# Patient Record
Sex: Male | Born: 1960 | Race: Black or African American | Hispanic: No | Marital: Married | Smoking: Current every day smoker
Health system: Southern US, Community
[De-identification: ages and names within clinical notes are randomized; demographics above are authoritative.]

## PROBLEM LIST (undated history)

## (undated) DIAGNOSIS — T7840XA Allergy, unspecified, initial encounter: Secondary | ICD-10-CM

## (undated) HISTORY — PX: NO PAST SURGERIES: SHX2092

---

## 2005-08-06 ENCOUNTER — Emergency Department (HOSPITAL_COMMUNITY): Admission: EM | Admit: 2005-08-06 | Discharge: 2005-08-06 | Payer: Self-pay | Admitting: *Deleted

## 2005-10-10 ENCOUNTER — Emergency Department (HOSPITAL_COMMUNITY): Admission: EM | Admit: 2005-10-10 | Discharge: 2005-10-10 | Payer: Self-pay | Admitting: Emergency Medicine

## 2008-09-02 ENCOUNTER — Emergency Department (HOSPITAL_COMMUNITY): Admission: EM | Admit: 2008-09-02 | Discharge: 2008-09-02 | Payer: Self-pay | Admitting: Emergency Medicine

## 2012-10-14 ENCOUNTER — Encounter (HOSPITAL_COMMUNITY): Payer: Self-pay | Admitting: Emergency Medicine

## 2012-10-14 ENCOUNTER — Emergency Department (HOSPITAL_COMMUNITY)
Admission: EM | Admit: 2012-10-14 | Discharge: 2012-10-14 | Disposition: A | Discharge status: Home / Self Care | Payer: Self-pay | Attending: Emergency Medicine | Admitting: Emergency Medicine

## 2012-10-14 DIAGNOSIS — J309 Allergic rhinitis, unspecified: Secondary | ICD-10-CM | POA: Insufficient documentation

## 2012-10-14 DIAGNOSIS — J302 Other seasonal allergic rhinitis: Secondary | ICD-10-CM

## 2012-10-14 DIAGNOSIS — J019 Acute sinusitis, unspecified: Secondary | ICD-10-CM

## 2012-10-14 HISTORY — DX: Allergy, unspecified, initial encounter: T78.40XA

## 2012-10-14 MED ORDER — AMOXICILLIN 500 MG PO CAPS: 1000.0000 mg | ORAL_CAPSULE | Freq: Three times a day (TID) | ORAL | Status: DC

## 2012-10-14 NOTE — ED Provider Notes (Signed)
History     CSN: [id number]  Arrival date & time 10/14/12  1231   First MD Initiated Contact with Patient 10/14/12 1328      Chief Complaint  Patient presents with  . Facial Swelling    (Consider location/radiation/quality/duration/timing/severity/associated sxs/prior treatment) HPI Comments: Patient presents with a chief complaint of swelling of the right side of his face in the area of the maxillary sinus.  He reports that he feels that area has been swollen for the past 2-3 days.  Today he felt that the left side of his face became swollen.  He reports that he does feel pressure in that area.  He denies any erythema or warmth.  He has had nasal congestion and post nasal drip also for the past several days.  No fever or chills.  Denies swelling of his lips, tongue, or throat.  Denies any dental pain.  He has not taken anything for his symptoms.  He does have a history of Seasonal Allergies and Sinus Infections.    The history is provided by the patient.    Past Medical History  Diagnosis Date  . Allergic     No past surgical history on file.  No family history on file.  History  Substance Use Topics  . Smoking status: Not on file  . Smokeless tobacco: Not on file  . Alcohol Use: Not on file      Review of Systems  Constitutional: Negative for fever and chills.  HENT: Positive for congestion, rhinorrhea and sinus pressure.   Skin: Negative for color change.  All other systems reviewed and are negative.    Allergies  Review of patient's allergies indicates no known allergies.  Home Medications  No current outpatient prescriptions on file.  BP 136/82  Pulse 84  Temp(Src) 98.4 F (36.9 C) (Oral)  Resp 18  SpO2 100%  Physical Exam  Nursing note and vitals reviewed. Constitutional: He appears well-developed and well-nourished.  HENT:  Head: Normocephalic and atraumatic. No trismus in the jaw.  Right Ear: Hearing, tympanic membrane and ear canal normal.   Left Ear: Hearing, tympanic membrane and ear canal normal.  Nose: Mucosal edema and rhinorrhea present. Right sinus exhibits maxillary sinus tenderness. Right sinus exhibits no frontal sinus tenderness. Left sinus exhibits maxillary sinus tenderness. Left sinus exhibits no frontal sinus tenderness.  Mouth/Throat: Uvula is midline, oropharynx is clear and moist and mucous membranes are normal.  No swelling of the face visualized No erythema or warmth of the face.  Neck: Normal range of motion. Neck supple.  Cardiovascular: Normal rate, regular rhythm and normal heart sounds.   Pulmonary/Chest: Effort normal and breath sounds normal.  Neurological: He is alert.  Skin: Skin is warm and dry. No erythema.    ED Course  Procedures (including critical care time)  Labs Reviewed - No data to display No results found.   No diagnosis found.    MDM  Patient presenting with a chief complaint of tenderness and swelling of the area of the maxillary sinuses.  No obvious swelling on exam.  No erythema or warmth of the skin.  Suspect Acute Sinusitis.  Patient treated with antibiotic.  Return precautions given.        Pascal Lux [place], PA-C 10/15/12 1620

## 2012-10-14 NOTE — ED Notes (Signed)
On Mon pt began having swelling to rt side of face and then noticed it to cheek ara. Not on any bp medications no swelling noted any other areas. Intermit pain. No sob, denies any allergy

## 2012-10-16 NOTE — ED Provider Notes (Signed)
Medical screening examination/treatment/procedure(s) were performed by non-physician practitioner and as supervising physician I was immediately available for consultation/collaboration.  Chrishawna Farina R. Emanuella Nickle, MD 10/16/12 0737 

## 2013-02-09 ENCOUNTER — Encounter: Payer: Self-pay | Admitting: *Deleted

## 2013-03-27 ENCOUNTER — Emergency Department (HOSPITAL_COMMUNITY)
Admission: EM | Admit: 2013-03-27 | Discharge: 2013-03-27 | Disposition: A | Discharge status: Home / Self Care | Payer: Self-pay | Attending: Emergency Medicine | Admitting: Emergency Medicine

## 2013-03-27 ENCOUNTER — Encounter (HOSPITAL_COMMUNITY): Payer: Self-pay | Admitting: *Deleted

## 2013-03-27 DIAGNOSIS — Z792 Long term (current) use of antibiotics: Secondary | ICD-10-CM | POA: Insufficient documentation

## 2013-03-27 DIAGNOSIS — F1420 Cocaine dependence, uncomplicated: Secondary | ICD-10-CM | POA: Insufficient documentation

## 2013-03-27 NOTE — ED Provider Notes (Signed)
CSN: [id number]     Arrival date & time 03/27/13  1924 History  This chart was scribed for non-physician practitioner,Joselito Fieldhouse Hector Shade, PA-C ,working with Suzi Roots, MD, by Karle Plumber, ED Scribe.  This patient was seen in room [facility]TR3/[facility]3 and the patient's care was started at 7:57 PM.    Chief Complaint  Patient presents with  . Medical Clearance   The history is provided by the patient. No language interpreter was used.   HPI Comments:  Brett Wood is a 52 y.o. male who presents to the Emergency Department complaining of cocaine dependence. He states he wants to go through detox and have help with quitting. His last use approximately 24 hours ago. He denies hallucinations and suicidal or homicidal ideations. He states he drinks alcohol and smokes marijuana occasionally. Pt denies nausea, vomiting, or any other pertinent medical history.  Past Medical History  Diagnosis Date  . Allergic    No past surgical history on file. No family history on file. History  Substance Use Topics  . Smoking status: Not on file  . Smokeless tobacco: Not on file  . Alcohol Use: Not on file    Review of Systems  Constitutional: Negative for fever.  Gastrointestinal: Negative for nausea, vomiting and abdominal pain.  Psychiatric/Behavioral: Negative for suicidal ideas and hallucinations.  All other systems reviewed and are negative.    Allergies  Review of patient's allergies indicates no known allergies.  Home Medications   Current Outpatient Rx  Name  Route  Sig  Dispense  Refill  . amoxicillin (AMOXIL) 500 MG capsule   Oral   Take 2 capsules (1,000 mg total) by mouth 3 (three) times daily.   60 capsule   0    Triage Vitals: BP 151/80  Pulse 55  Temp(Src) 98.6 F (37 C) (Oral)  Resp 18  Ht 5\' 11"  (1.803 m)  Wt 185 lb (83.915 kg)  BMI 25.81 kg/m2  SpO2 100% Physical Exam  Nursing note and vitals reviewed. Constitutional: He is oriented to person, place, and time. He  appears well-developed and well-nourished.  HENT:  Head: Normocephalic and atraumatic.  Neck: Normal range of motion.  Cardiovascular: Normal rate and regular rhythm.   Pulmonary/Chest: Effort normal.  Abdominal: Soft. Bowel sounds are normal.  Neurological: He is alert and oriented to person, place, and time.  Skin: Skin is warm and dry.  Psychiatric: He has a normal mood and affect. His behavior is normal.    ED Course  Procedures (including critical care time) DIAGNOSTIC STUDIES: Oxygen Saturation is 100% on RA, normal by my interpretation.   COORDINATION OF CARE: 8:03 PM- Will provide information for community support and detox programs. Pt verbalizes understanding and agrees to plan.  Labs Review Labs Reviewed - No data to display Imaging Review No results found.  MDM  No diagnosis found. 1. Cocaine dependence  I personally performed the services described in this documentation, which was scribed in my presence. The recorded information has been reviewed and is accurate.   He is not homicidal or suicidal. Discussed resources for outpatient support in breaking his dependence on cocaine, and resources were provided. Stable for discharge.   Arnoldo Hooker, PA-C 03/27/13 2009  Arnoldo Hooker, PA-C 03/27/13 2011

## 2013-03-27 NOTE — ED Notes (Signed)
PA at bedside.

## 2013-03-27 NOTE — ED Notes (Signed)
Pt states he has used cocaine since he was 52 years old and he wants detox from cocaine,  Pt is alert and oriented and behavior apprioprate

## 2013-03-29 NOTE — ED Provider Notes (Signed)
Medical screening examination/treatment/procedure(s) were performed by non-physician practitioner and as supervising physician I was immediately available for consultation/collaboration.   Suzi Roots, MD 03/29/13 [phone]

## 2016-12-26 ENCOUNTER — Encounter (HOSPITAL_COMMUNITY): Payer: Self-pay

## 2016-12-26 ENCOUNTER — Emergency Department (HOSPITAL_COMMUNITY): Payer: Self-pay

## 2016-12-26 ENCOUNTER — Inpatient Hospital Stay (HOSPITAL_COMMUNITY)
Admission: EM | Admit: 2016-12-26 | Discharge: 2016-12-28 | DRG: 728 | Disposition: A | Discharge status: Home / Self Care | Payer: Self-pay | Attending: Nephrology | Admitting: Nephrology

## 2016-12-26 DIAGNOSIS — N3090 Cystitis, unspecified without hematuria: Secondary | ICD-10-CM

## 2016-12-26 DIAGNOSIS — Z720 Tobacco use: Secondary | ICD-10-CM

## 2016-12-26 DIAGNOSIS — N451 Epididymitis: Principal | ICD-10-CM | POA: Diagnosis present

## 2016-12-26 DIAGNOSIS — K5900 Constipation, unspecified: Secondary | ICD-10-CM | POA: Diagnosis present

## 2016-12-26 DIAGNOSIS — R739 Hyperglycemia, unspecified: Secondary | ICD-10-CM | POA: Diagnosis present

## 2016-12-26 DIAGNOSIS — N390 Urinary tract infection, site not specified: Secondary | ICD-10-CM

## 2016-12-26 DIAGNOSIS — N3091 Cystitis, unspecified with hematuria: Secondary | ICD-10-CM | POA: Diagnosis present

## 2016-12-26 DIAGNOSIS — B9620 Unspecified Escherichia coli [E. coli] as the cause of diseases classified elsewhere: Secondary | ICD-10-CM | POA: Diagnosis present

## 2016-12-26 DIAGNOSIS — N5089 Other specified disorders of the male genital organs: Secondary | ICD-10-CM | POA: Diagnosis present

## 2016-12-26 DIAGNOSIS — N50819 Testicular pain, unspecified: Secondary | ICD-10-CM

## 2016-12-26 DIAGNOSIS — F17210 Nicotine dependence, cigarettes, uncomplicated: Secondary | ICD-10-CM | POA: Diagnosis present

## 2016-12-26 LAB — COMPREHENSIVE METABOLIC PANEL
ALBUMIN: 3.5 g/dL (ref 3.5–5.0)
ALT: 13 U/L — ABNORMAL LOW (ref 17–63)
ANION GAP: 10 (ref 5–15)
AST: 15 U/L (ref 15–41)
Alkaline Phosphatase: 77 U/L (ref 38–126)
BUN: 20 mg/dL (ref 6–20)
CHLORIDE: 102 mmol/L (ref 101–111)
CO2: 23 mmol/L (ref 22–32)
Calcium: 10.3 mg/dL (ref 8.9–10.3)
Creatinine, Ser: 1.05 mg/dL (ref 0.61–1.24)
GFR calc Af Amer: >60 mL/min (ref >60)
GFR calc non Af Amer: >60 mL/min (ref >60)
GLUCOSE: 156 mg/dL — AB (ref 65–99)
POTASSIUM: 4 mmol/L (ref 3.5–5.1)
SODIUM: 135 mmol/L (ref 135–145)
Total Bilirubin: 1.1 mg/dL (ref 0.3–1.2)
Total Protein: 8.2 g/dL — ABNORMAL HIGH (ref 6.5–8.1)

## 2016-12-26 LAB — CBC
HEMATOCRIT: 40.7 % (ref 39.0–52.0)
HEMOGLOBIN: 13.8 g/dL (ref 13.0–17.0)
MCH: 30.5 pg (ref 26.0–34.0)
MCHC: 33.9 g/dL (ref 30.0–36.0)
MCV: 89.8 fL (ref 78.0–100.0)
Platelets: 199 10*3/uL (ref 150–400)
RBC: 4.53 MIL/uL (ref 4.22–5.81)
RDW: 13.4 % (ref 11.5–15.5)
WBC: 15.1 10*3/uL — ABNORMAL HIGH (ref 4.0–10.5)

## 2016-12-26 LAB — URINALYSIS, ROUTINE W REFLEX MICROSCOPIC
GLUCOSE, UA: NEGATIVE mg/dL
KETONES UR: 15 mg/dL — AB
NITRITE: POSITIVE — AB
Specific Gravity, Urine: >1.03 — ABNORMAL HIGH (ref 1.005–1.030)
Squamous Epithelial / LPF: NONE SEEN
pH: 6 (ref 5.0–8.0)

## 2016-12-26 LAB — LIPASE, BLOOD: Lipase: 29 U/L (ref 11–51)

## 2016-12-26 LAB — I-STAT CG4 LACTIC ACID, ED
Lactic Acid, Venous: 1.35 mmol/L (ref 0.5–1.9)
Lactic Acid, Venous: 1.05 mmol/L (ref 0.5–1.9)

## 2016-12-26 MED ORDER — MORPHINE SULFATE (PF) 2 MG/ML IV SOLN: 4.0000 mg | Freq: Once | INTRAVENOUS | Status: DC

## 2016-12-26 MED ORDER — MORPHINE SULFATE (PF) 2 MG/ML IV SOLN: 2.0000 mg | INTRAVENOUS | Status: DC | PRN

## 2016-12-26 MED ORDER — ACETAMINOPHEN 325 MG PO TABS
650.0000 mg | ORAL_TABLET | Freq: Once | ORAL | Status: AC
  Administered 2016-12-26: 650 mg via ORAL
  Filled 2016-12-26: qty 2

## 2016-12-26 MED ORDER — ORAL CARE MOUTH RINSE
15.0000 mL | Freq: Two times a day (BID) | OROMUCOSAL | Status: DC
  Administered 2016-12-26: 15 mL via OROMUCOSAL

## 2016-12-26 MED ORDER — NICOTINE 14 MG/24HR TD PT24
14.0000 mg | MEDICATED_PATCH | Freq: Every day | TRANSDERMAL | Status: DC
  Administered 2016-12-26 – 2016-12-28 (×3): 14 mg via TRANSDERMAL
  Filled 2016-12-26 (×3): qty 1

## 2016-12-26 MED ORDER — SODIUM CHLORIDE 0.9 % IV BOLUS (SEPSIS)
1000.0000 mL | Freq: Once | INTRAVENOUS | Status: AC
  Administered 2016-12-26: 1000 mL via INTRAVENOUS

## 2016-12-26 MED ORDER — ADULT MULTIVITAMIN W/MINERALS CH
1.0000 | ORAL_TABLET | Freq: Every day | ORAL | Status: DC
  Administered 2016-12-26 – 2016-12-27 (×2): 1 via ORAL
  Filled 2016-12-26 (×3): qty 1

## 2016-12-26 MED ORDER — CHLORHEXIDINE GLUCONATE 0.12 % MT SOLN
15.0000 mL | Freq: Two times a day (BID) | OROMUCOSAL | Status: DC
  Administered 2016-12-26: 15 mL via OROMUCOSAL
  Filled 2016-12-26: qty 15

## 2016-12-26 MED ORDER — ENOXAPARIN SODIUM 40 MG/0.4ML ~~LOC~~ SOLN
40.0000 mg | SUBCUTANEOUS | Status: DC
  Administered 2016-12-26 – 2016-12-27 (×2): 40 mg via SUBCUTANEOUS
  Filled 2016-12-26 (×2): qty 0.4

## 2016-12-26 MED ORDER — LEVOFLOXACIN IN D5W 500 MG/100ML IV SOLN
500.0000 mg | Freq: Once | INTRAVENOUS | Status: AC
  Administered 2016-12-26: 500 mg via INTRAVENOUS
  Filled 2016-12-26: qty 100

## 2016-12-26 MED ORDER — SODIUM CHLORIDE 0.9 % IV SOLN
INTRAVENOUS | Status: AC
  Administered 2016-12-26 – 2016-12-27 (×3): via INTRAVENOUS

## 2016-12-26 MED ORDER — DEXTROSE 5 % IV SOLN: 1.0000 g | Freq: Once | INTRAVENOUS | Status: DC

## 2016-12-26 MED ORDER — VITAMIN B-1 100 MG PO TABS
100.0000 mg | ORAL_TABLET | Freq: Every day | ORAL | Status: DC
  Administered 2016-12-26 – 2016-12-27 (×2): 100 mg via ORAL
  Filled 2016-12-26 (×3): qty 1

## 2016-12-26 MED ORDER — ONDANSETRON HCL 4 MG PO TABS: 4.0000 mg | ORAL_TABLET | Freq: Four times a day (QID) | ORAL | Status: DC | PRN

## 2016-12-26 MED ORDER — LEVOFLOXACIN IN D5W 500 MG/100ML IV SOLN
500.0000 mg | INTRAVENOUS | Status: DC
  Administered 2016-12-27 – 2016-12-28 (×2): 500 mg via INTRAVENOUS
  Filled 2016-12-26 (×3): qty 100

## 2016-12-26 MED ORDER — ONDANSETRON HCL 4 MG/2ML IJ SOLN: 4.0000 mg | Freq: Once | INTRAMUSCULAR | Status: DC

## 2016-12-26 MED ORDER — OXYCODONE HCL 5 MG PO TABS: 5.0000 mg | ORAL_TABLET | ORAL | Status: DC | PRN

## 2016-12-26 MED ORDER — ACETAMINOPHEN 650 MG RE SUPP: 650.0000 mg | Freq: Four times a day (QID) | RECTAL | Status: DC | PRN

## 2016-12-26 MED ORDER — ACETAMINOPHEN 325 MG PO TABS
650.0000 mg | ORAL_TABLET | Freq: Four times a day (QID) | ORAL | Status: DC | PRN
  Administered 2016-12-26: 650 mg via ORAL
  Filled 2016-12-26: qty 2

## 2016-12-26 MED ORDER — ONDANSETRON HCL 4 MG/2ML IJ SOLN: 4.0000 mg | Freq: Four times a day (QID) | INTRAMUSCULAR | Status: DC | PRN

## 2016-12-26 NOTE — ED Notes (Signed)
Bed: [id number] Expected date:  Expected time:  Means of arrival:  Comments: EMS- 55yo M , abdominal/groin pain, hematuria

## 2016-12-26 NOTE — ED Notes (Signed)
EDPA Provider at bedside. 

## 2016-12-26 NOTE — ED Notes (Signed)
[name] AT BEDSIDE PERFORMING BLOOD CULTURES

## 2016-12-26 NOTE — ED Notes (Signed)
INQUIRED FROM MATT Q RN IF BLOOD CULTURES WERE OBTAINED BEFORE ANTIBIOTIC GIVEN. NO BLOOD CULTURES WERE OBTAINED

## 2016-12-26 NOTE — ED Notes (Signed)
PT DRINKS " NOT EVERY DAY" HOWEVER DRINKS 1-2 40'S. LAST DRINK Saturday. STARTED FEELING BAD Sunday.

## 2016-12-26 NOTE — H&P (Signed)
[facility] History and Physical  Brett Wood ZOX:[id number] DOB: [birth year] DOA: 12/26/2016   PCP: Does not have a primary care doctor Specialists: None  Chief Complaint: Pain with urination and scrotal pain  HPI: Brett Wood is a 56 y.o. male with the no significant past medical history who does not take any medications at home and does not really follow-up with the medical providers who was in his usual state of health till about 2-3 days ago when he started having difficulty urinating. He had blood in the urine once couple days ago but nothing since then. However, he's had significant burning sensation with urination. He is also noted swelling of his scrotum, left side more than the right. He's had fever, chills. He has had nausea, vomiting, loss of appetite. The pain in the left side of the scrotum was 8 out of 10 in intensity. Worse with movement. No radiation. Sharp pain. He's never had similar issues before. Denies any abdominal or flank pain. No Back pain. He is heterosexual. Last sexual encounter was month and a half ago. He has protected sex and uses a condom. Denies any anal intercourse. Denies any history of STDs.  In the emergency department, patient was found to have an abnormal UA with leukocytosis. Ultrasound of the scrotum suggested epididymitis. No evidence for torsion.  Home Medications: Prior to Admission medications   Not on File    Allergies: No Known Allergies  Past Medical History: Past Medical History:  Diagnosis Date  . Allergic    Social History: Lives in [place]. Smokes 5-6 cigarettes on a daily basis.1-2 beers on a weekly basis. Last drink was on Saturday. Denies any illicit drug use. Independent with daily activities.   Family History: Patient denies any health problems in his family  Review of Systems - History obtained from the patient General ROS: positive for  - chills, fatigue and fever Psychological ROS: negative Ophthalmic ROS:  negative ENT ROS: negative Allergy and Immunology ROS: negative Hematological and Lymphatic ROS: negative Endocrine ROS: negative Respiratory ROS: no cough, shortness of breath, or wheezing Cardiovascular ROS: no chest pain or dyspnea on exertion Gastrointestinal ROS: As in history of present illness Genito-Urinary ROS: As in history of present illness Musculoskeletal ROS: negative Neurological ROS: no TIA or stroke symptoms Dermatological ROS: negative  Physical Examination  Vitals:   12/26/16 1033 12/26/16 1222 12/26/16 1347 12/26/16 1530  BP: 113/60 140/71 132/82 127/75  Pulse: 91 95 93 83  Resp: 18 18 18    Temp: (!) 102.6 F (39.2 C)  99.7 F (37.6 C)   TempSrc: Oral  Oral   SpO2: 99% 100% 99% 99%  Weight:      Height:        BP 127/75   Pulse 83   Temp 99.7 F (37.6 C) (Oral)   Resp 18   Ht 5\' 11"  (1.803 m)   Wt 83.9 kg (185 lb)   SpO2 99%   BMI 25.80 kg/m   General appearance: alert, cooperative, appears stated age and no distress Head: Normocephalic, without obvious abnormality, atraumatic Eyes: conjunctivae/corneas clear. PERRL, EOM's intact. Fundi benign. Throat: lips, mucosa, and tongue normal; teeth and gums normal and Dry mucous membranes Neck: no adenopathy, no carotid bruit, no JVD, supple, symmetrical, trachea midline and thyroid not enlarged, symmetric, no tenderness/mass/nodules Resp: clear to auscultation bilaterally Cardio: regular rate and rhythm, S1, S2 normal, no murmur, click, rub or gallop GI: Abdomen is soft. Mild tenderness appreciated in the suprapubic area without any rebound,  rigidity or guarding. No masses or organomegaly. Bowel sounds are present. Male genitalia: Left scrotum is noted to be enlarged compared to the right. Very tender to palpation. Right-sided testes normal. Extremities: extremities normal, atraumatic, no cyanosis or edema Pulses: 2+ and symmetric Skin: Skin color, texture, turgor normal. No rashes or lesions Lymph  nodes: Cervical, supraclavicular, and axillary nodes normal. Neurologic: Awake and alert. Oriented 2. No focal neurological deficits.   Labs on Admission: I have personally reviewed following labs and imaging studies  CBC:  Recent Labs Lab 12/26/16 1041  WBC 15.1*  HGB 13.8  HCT 40.7  MCV 89.8  PLT 199   Basic Metabolic Panel:  Recent Labs Lab 12/26/16 1041  NA 135  K 4.0  CL 102  CO2 23  GLUCOSE 156*  BUN 20  CREATININE 1.05  CALCIUM 10.3   GFR: Estimated Creatinine Clearance: 86.7 mL/min (by C-G formula based on SCr of 1.05 mg/dL). Liver Function Tests:  Recent Labs Lab 12/26/16 1041  AST 15  ALT 13*  ALKPHOS 77  BILITOT 1.1  PROT 8.2*  ALBUMIN 3.5    Recent Labs Lab 12/26/16 1041  LIPASE 29    Radiological Exams on Admission: US Scrotum  Result Date: 12/26/2016 CLINICAL DATA:  Acute left testicular pain. EXAM: SCROTAL ULTRASOUND DOPPLER ULTRASOUND OF THE TESTICLES TECHNIQUE: Complete ultrasound examination of the testicles, epididymis, and other scrotal structures was performed. Color and spectral Doppler ultrasound were also utilized to evaluate blood flow to the testicles. COMPARISON:  None. FINDINGS: Right testicle Measurements: 4.6 x 2.7 x 2.7 cm. No mass visualized. Multiple small calcifications are noted suggesting microlithiasis. Left testicle Measurements: 5.1 x 3.3 x 3.2 cm. No mass visualized. Multiple small calcifications are noted suggesting microlithiasis. Right epididymis:  3 mm cyst is noted.  Otherwise normal. Left epididymis: Significantly enlarged with increased flow seen on Doppler suggesting epididymitis. Hydrocele:  Mild left hydrocele is noted. Varicocele:  None visualized. Pulsed Doppler interrogation of both testes demonstrates normal low resistance arterial and venous waveforms bilaterally. IMPRESSION: Enlarged and hypervascular left epididymis is noted suggesting epididymitis. Mild left hydrocele is noted as well. No evidence of  testicular mass or torsion. 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 Current literature suggests that testicular microlithiasis is not a significant independent risk factor for development of testicular carcinoma, and that follow up imaging is not warranted in the absence of other risk factors. Monthly testicular self-examination and annual physical exams are considered appropriate surveillance. If patient has other risk factors for testicular carcinoma, then referral to Urology should be considered. (Reference: DeCastro, et al.: A 5-Year Follow up Study of Asymptomatic Men with Testicular Microlithiasis. J Urol 2008; 179:1420-1423.). Electronically Signed   By: Lupita Raider, M.D.   On: 12/26/2016 12:19   [place] Art/ven Flow Abd Pelv Doppler  Result Date: 12/26/2016 CLINICAL DATA:  Acute left testicular pain. EXAM: SCROTAL ULTRASOUND DOPPLER ULTRASOUND OF THE TESTICLES TECHNIQUE: Complete ultrasound examination of the testicles, epididymis, and other scrotal structures was performed. Color and spectral Doppler ultrasound were also utilized to evaluate blood flow to the testicles. COMPARISON:  None. FINDINGS: Right testicle Measurements: 4.6 x 2.7 x 2.7 cm. No mass visualized. Multiple small calcifications are noted suggesting microlithiasis. Left testicle Measurements: 5.1 x 3.3 x 3.2 cm. No mass visualized. Multiple small calcifications are noted suggesting microlithiasis. Right epididymis:  3 mm cyst is noted.  Otherwise normal. Left epididymis: Significantly enlarged with increased flow seen on Doppler suggesting epididymitis. Hydrocele:  Mild left hydrocele is noted. Varicocele:  None visualized. Pulsed Doppler interrogation of both testes demonstrates normal low resistance arterial and venous waveforms bilaterally. IMPRESSION: Enlarged and hypervascular left epididymis is noted suggesting epididymitis. Mild left hydrocele is noted as well. No evidence of testicular mass or torsion. Probable mild  bilateral testicular microlithiasis. Current literature suggests that testicular microlithiasis is not a significant independent risk factor for development of testicular carcinoma, and that follow up imaging is not warranted in the absence of other risk factors. Monthly testicular self-examination and annual physical exams are considered appropriate surveillance. If patient has other risk factors for testicular carcinoma, then referral to Urology should be considered. (Reference: DeCastro, et al.: A 5-Year Follow up Study of Asymptomatic Men with Testicular Microlithiasis. J Urol 2008; 179:1420-1423.). Electronically Signed   By: Lupita RaiderJames  Green Jr, M.D.   On: 12/26/2016 12:19     Problem List  Principal Problem:   Acute epididymitis Active Problems:   Acute lower UTI   Assessment: This is a 56 year old African American male who presents with dysuria, one episode of hematuria, left-sided scrotal swelling. Patient has a UTI and acute epididymitis.  Plan: #1 Urinary tract infection with acute epididymitis: GC and chlamydia probe has been ordered. Patient denies any high risk sexual behavior. Ultrasound does not show any torsion. Microlithiasis is noted. Will need outpatient urology follow-up. One episode of hematuria was most likely due to his urinary tract infection. Patient denies any back pain. His creatinine is normal. If he has recurrent episodes of hematuria he will need to undergo CT renal study. He will be treated with the Levaquin for now. Scrotal support. Observation overnight. Follow-up on HIV test.  #2 mild hyperglycemia: Follow-up on fasting level tomorrow.  #3 tobacco abuse: Nicotine patch. Patient was counseled.   DVT Prophylaxis: Lovenox Code Status: Full code Family Communication: Discussed with the patient  Disposition Plan: Likely return home when improved  Consults called: None  Admission status: Observation   Severity of Illness: The appropriate patient status for this  patient is OBSERVATION. Observation status is judged to be reasonable and necessary in order to provide the required intensity of service to ensure the patient's safety. The patient's presenting symptoms, physical exam findings, and initial radiographic and laboratory data in the context of their medical condition is felt to place them at decreased risk for further clinical deterioration. Furthermore, it is anticipated that the patient will be medically stable for discharge from the hospital within 2 midnights of admission. The following factors support the patient status of observation.   " The patient's presenting symptoms include dysuria. " The physical exam findings include mild suprapubic tenderness. " The initial radiographic and laboratory data suggests leukocytosis, UTI, epididymitis.   Further management decisions will depend on results of further testing and patient's response to treatment.   [facility] Maine Medical CenterKRISHNAN,Danney Bungert  [facility] Pager [phone]  If 7PM-7AM, please contact night-coverage [email or web address] Password [email or web address]  12/26/2016, 5:12 PM

## 2016-12-26 NOTE — ED Provider Notes (Signed)
[facility]-EMERGENCY DEPT Provider Note   CSN: [id number] Arrival date & time: 12/26/16  1022     History   Chief Complaint Chief Complaint  Patient presents with  . Abdominal Pain  . Hematuria  . Nausea  . Emesis    HPI Brett Wood is a 56 y.o. male.  HPI Brett Wood is a 56 y.o. male presents to emergency department complaining of abdominal pain, nausea, vomiting, hematuria, urinary urgency and hesitancy, scrotal pain. Patient states his symptoms started 4 days ago. Reports some difficulty urinating and noticing blood in his urine. Reports some constipation, denies diarrhea or blood in his stool. Denies blood in his emesis. Denies any fever or chills, however febrile in the emergency department. Reports some associated back pain. States pain that he is feeling comes and goes. Pain is sharp. Denies any history of the same. No medications taken prior to coming in.  Past Medical History:  Diagnosis Date  . Allergic     There are no active problems to display for this patient.   History reviewed. No pertinent surgical history.     Home Medications    Prior to Admission medications   Medication Sig Start Date End Date Taking? Authorizing Provider  amoxicillin (AMOXIL) 500 MG capsule Take 2 capsules (1,000 mg total) by mouth 3 (three) times daily. 10/14/12   Santiago GladLaisure, Heather, PA-C    Family History No family history on file.  Social History Social History  Substance Use Topics  . Smoking status: Current Every Day Smoker  . Smokeless tobacco: Never Used  . Alcohol use Yes     Allergies   Patient has no known allergies.   Review of Systems Review of Systems  Constitutional: Negative for chills and fever.  Respiratory: Negative for cough, chest tightness and shortness of breath.   Cardiovascular: Negative for chest pain, palpitations and leg swelling.  Gastrointestinal: Positive for abdominal pain, constipation, nausea and vomiting. Negative for abdominal distention,  blood in stool and diarrhea.  Genitourinary: Positive for difficulty urinating, hematuria, scrotal swelling, testicular pain and urgency. Negative for dysuria, frequency and penile swelling.  Musculoskeletal: Negative for arthralgias, myalgias, neck pain and neck stiffness.  Skin: Negative for rash.  Allergic/Immunologic: Negative for immunocompromised state.  Neurological: Negative for dizziness, weakness, light-headedness, numbness and headaches.  All other systems reviewed and are negative.    Physical Exam Updated Vital Signs BP 113/60 (BP Location: Right Arm)   Pulse 91   Temp (!) 102.6 F (39.2 C) (Oral)   Resp 18   Ht 5\' 11"  (1.803 m)   Wt 83.9 kg (185 lb)   SpO2 99%   BMI 25.80 kg/m   Physical Exam  Constitutional: He appears well-developed and well-nourished. No distress.  HENT:  Head: Normocephalic and atraumatic.  Eyes: Conjunctivae are normal.  Neck: Neck supple.  Cardiovascular: Normal rate, regular rhythm and normal heart sounds.   Pulmonary/Chest: Effort normal. No respiratory distress. He has no wheezes. He has no rales.  Abdominal: Soft. Bowel sounds are normal. He exhibits no distension. There is tenderness. There is no rebound.  LLQ tenderness, no guarding  Genitourinary: Penis normal.  Genitourinary Comments: Right scrotum normal. Left scrotum is enlarged, diffusely tender.  Musculoskeletal: He exhibits no edema.  Neurological: He is alert.  Skin: Skin is warm and dry.  Nursing note and vitals reviewed.    ED Treatments / Results  Labs (all labs ordered are listed, but only abnormal results are displayed) Labs Reviewed  COMPREHENSIVE METABOLIC PANEL -  Abnormal; Notable for the following:       Result Value   Glucose, Bld 156 (*)    Total Protein 8.2 (*)    ALT 13 (*)    All other components within normal limits  CBC - Abnormal; Notable for the following:    WBC 15.1 (*)    All other components within normal limits  URINALYSIS, ROUTINE W  REFLEX MICROSCOPIC - Abnormal; Notable for the following:    Color, Urine AMBER (*)    APPearance CLOUDY (*)    Specific Gravity, Urine >1.030 (*)    Hgb urine dipstick LARGE (*)    Bilirubin Urine SMALL (*)    Ketones, ur 15 (*)    Protein, ur >300 (*)    Nitrite POSITIVE (*)    Leukocytes, UA TRACE (*)    Bacteria, UA MANY (*)    All other components within normal limits  CULTURE, BLOOD (ROUTINE X 2)  CULTURE, BLOOD (ROUTINE X 2)  URINE CULTURE  LIPASE, BLOOD  I-STAT CG4 LACTIC ACID, ED  I-STAT CG4 LACTIC ACID, ED  GC/CHLAMYDIA PROBE AMP ([place]) NOT AT [facility]    EKG  EKG Interpretation None       Radiology US Scrotum  Result Date: 12/26/2016 CLINICAL DATA:  Acute left testicular pain. EXAM: SCROTAL ULTRASOUND DOPPLER ULTRASOUND OF THE TESTICLES TECHNIQUE: Complete ultrasound examination of the testicles, epididymis, and other scrotal structures was performed. Color and spectral Doppler ultrasound were also utilized to evaluate blood flow to the testicles. COMPARISON:  None. FINDINGS: Right testicle Measurements: 4.6 x 2.7 x 2.7 cm. No mass visualized. Multiple small calcifications are noted suggesting microlithiasis. Left testicle Measurements: 5.1 x 3.3 x 3.2 cm. No mass visualized. Multiple small calcifications are noted suggesting microlithiasis. Right epididymis:  3 mm cyst is noted.  Otherwise normal. Left epididymis: Significantly enlarged with increased flow seen on Doppler suggesting epididymitis. Hydrocele:  Mild left hydrocele is noted. Varicocele:  None visualized. Pulsed Doppler interrogation of both testes demonstrates normal low resistance arterial and venous waveforms bilaterally. IMPRESSION: Enlarged and hypervascular left epididymis is noted suggesting epididymitis. Mild left hydrocele is noted as well. No evidence of testicular mass or torsion. 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 Current literature suggests that testicular microlithiasis is not a  significant independent risk factor for development of testicular carcinoma, and that follow up imaging is not warranted in the absence of other risk factors. Monthly testicular self-examination and annual physical exams are considered appropriate surveillance. If patient has other risk factors for testicular carcinoma, then referral to Urology should be considered. (Reference: DeCastro, et al.: A 5-Year Follow up Study of Asymptomatic Men with Testicular Microlithiasis. J Urol 2008; 179:1420-1423.). Electronically Signed   By: Lupita Raider, M.D.   On: 12/26/2016 12:19   [place] Art/ven Flow Abd Pelv Doppler  Result Date: 12/26/2016 CLINICAL DATA:  Acute left testicular pain. EXAM: SCROTAL ULTRASOUND DOPPLER ULTRASOUND OF THE TESTICLES TECHNIQUE: Complete ultrasound examination of the testicles, epididymis, and other scrotal structures was performed. Color and spectral Doppler ultrasound were also utilized to evaluate blood flow to the testicles. COMPARISON:  None. FINDINGS: Right testicle Measurements: 4.6 x 2.7 x 2.7 cm. No mass visualized. Multiple small calcifications are noted suggesting microlithiasis. Left testicle Measurements: 5.1 x 3.3 x 3.2 cm. No mass visualized. Multiple small calcifications are noted suggesting microlithiasis. Right epididymis:  3 mm cyst is noted.  Otherwise normal. Left epididymis: Significantly enlarged with increased flow seen on Doppler suggesting epididymitis. Hydrocele:  Mild left  hydrocele is noted. Varicocele:  None visualized. Pulsed Doppler interrogation of both testes demonstrates normal low resistance arterial and venous waveforms bilaterally. IMPRESSION: Enlarged and hypervascular left epididymis is noted suggesting epididymitis. Mild left hydrocele is noted as well. No evidence of testicular mass or torsion. 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 Current literature suggests that testicular microlithiasis is not a significant independent risk factor for  development of testicular carcinoma, and that follow up imaging is not warranted in the absence of other risk factors. Monthly testicular self-examination and annual physical exams are considered appropriate surveillance. If patient has other risk factors for testicular carcinoma, then referral to Urology should be considered. (Reference: DeCastro, et al.: A 5-Year Follow up Study of Asymptomatic Men with Testicular Microlithiasis. J Urol 2008; 179:1420-1423.). Electronically Signed   By: Lupita Raider, M.D.   On: 12/26/2016 12:19    Procedures Procedures (including critical care time)  Medications Ordered in ED Medications  acetaminophen (TYLENOL) tablet 650 mg (not administered)     Initial Impression / Assessment and Plan / ED Course  I have reviewed the triage vital signs and the nursing notes.  Pertinent labs & imaging results that were available during my care of the patient were reviewed by me and considered in my medical decision making (see chart for details).     Patient emergency department with dysuria, urinary hesitancy and difficulty urinating. Also reports hematuria. On exam, mild lower abdominal pain mainly in the left lower quadrant, left testicular pain and swelling. Differential diagnosis includes UTI, epididymitis, orchitis, infected kidney stone. Patient is febrile here at 102.6. His heart rate is 91. Blood pressure is normal 113/60. Respiratory rate is normal. Will obtain blood cultures, lactic acid, CBC, CMP, urinalysis.   1:46 PM BP remaining stable. HR in 90s. US showing left epididymitis. Discussed with Dr. Madilyn Hook, will see pt. Covered with levaquin.  Discussed with hospitalist, will admit for further treatment.  Vitals:   12/26/16 1031 12/26/16 1033 12/26/16 1222 12/26/16 1347  BP:  113/60 140/71 132/82  Pulse:  91 95 93  Resp:  18 18 18   Temp:  (!) 102.6 F (39.2 C)  99.7 F (37.6 C)  TempSrc:  Oral  Oral  SpO2:  99% 100% 99%  Weight: 83.9 kg (185 lb)      Height: 5\' 11"  (1.803 m)         Final Clinical Impressions(s) / ED Diagnoses   Final diagnoses:  Testicular pain  Epididymitis  Cystitis    New Prescriptions New Prescriptions   No medications on file     Jaynie Crumble, Cordelia Poche 12/26/16 1600    Tilden Fossa, MD 12/27/16 0700

## 2016-12-26 NOTE — ED Triage Notes (Signed)
Per [facility], Pt, from home, c/o lower abdominal pain radiating into bilateral groins and hematuria x 4 days and n/v starting today.  Pain 6/10  increasing w/ urination.  No history of kidney stones.  Pt has not taken anything for symptoms.

## 2016-12-27 DIAGNOSIS — N3090 Cystitis, unspecified without hematuria: Secondary | ICD-10-CM

## 2016-12-27 LAB — CBC
HCT: 39.3 % (ref 39.0–52.0)
Hemoglobin: 13.3 g/dL (ref 13.0–17.0)
MCH: 30.1 pg (ref 26.0–34.0)
MCHC: 33.8 g/dL (ref 30.0–36.0)
MCV: 88.9 fL (ref 78.0–100.0)
PLATELETS: 203 10*3/uL (ref 150–400)
RBC: 4.42 MIL/uL (ref 4.22–5.81)
RDW: 13.4 % (ref 11.5–15.5)
WBC: 16.2 10*3/uL — AB (ref 4.0–10.5)

## 2016-12-27 LAB — COMPREHENSIVE METABOLIC PANEL
ALK PHOS: 71 U/L (ref 38–126)
ALT: 11 U/L — AB (ref 17–63)
AST: 14 U/L — AB (ref 15–41)
Albumin: 3.1 g/dL — ABNORMAL LOW (ref 3.5–5.0)
Anion gap: 9 (ref 5–15)
BILIRUBIN TOTAL: 1.3 mg/dL — AB (ref 0.3–1.2)
BUN: 15 mg/dL (ref 6–20)
CO2: 24 mmol/L (ref 22–32)
CREATININE: 0.86 mg/dL (ref 0.61–1.24)
Calcium: 9.9 mg/dL (ref 8.9–10.3)
Chloride: 105 mmol/L (ref 101–111)
GFR calc Af Amer: >60 mL/min (ref >60)
Glucose, Bld: 102 mg/dL — ABNORMAL HIGH (ref 65–99)
Potassium: 3.9 mmol/L (ref 3.5–5.1)
Sodium: 138 mmol/L (ref 135–145)
TOTAL PROTEIN: 7.4 g/dL (ref 6.5–8.1)

## 2016-12-27 LAB — HIV ANTIBODY (ROUTINE TESTING W REFLEX): HIV Screen 4th Generation wRfx: NONREACTIVE

## 2016-12-27 MED ORDER — DEXTROSE 5 % IV SOLN
1.0000 g | Freq: Once | INTRAVENOUS | Status: AC
  Administered 2016-12-27: 1 g via INTRAVENOUS
  Filled 2016-12-27: qty 10

## 2016-12-27 NOTE — Progress Notes (Signed)
PROGRESS NOTE  Brett Wood ZOX:[id number] DOB: 08-Jun-1961 DOA: 12/26/2016 PCP: Lavinia Sharps, NP   LOS: 0 days   Brief Narrative / Interim history: 56 year old male with no significant medical history presents with burning urination for the last few days as well as swelling in his left scrotum.  He also is complaining of subjective fever and chills, nausea vomiting and poor appetite.  Assessment & Plan: Principal Problem:   Acute epididymitis Active Problems:   Acute lower UTI  Urinary tract infection -Patient with evidence of a UTI on urinalysis, urine cultures are pending -Started on IV Levaquin, continue for today, leukocytosis slightly worse today  Epididymitis -Provide a one-time dose of ceftriaxone to cover for STI and continue Levaquin -GC/chlamydia pending  Tobacco abuse -Nicotine patch  DVT prophylaxis: Lovenox Code Status: Full code Family Communication: Discussed with patient Disposition Plan: Home 1-2 days  Consultants:   None  Procedures:   none  Antimicrobials:  Ceftriaxone 1  Levaquin 6/14  Subjective: -No fevers this morning, complains of scrotal pain and burning with urination still.  Appears uncomfortable  Objective: Vitals:   12/26/16 1715 12/26/16 1734 12/26/16 2040 12/27/16 0525  BP: 135/80 (!) 153/82 136/82   Pulse: 83 86 86 77  Resp: 18 18 18 18   Temp: 99.6 F (37.6 C) 99.6 F (37.6 C) 100.3 F (37.9 C) 98 F (36.7 C)  TempSrc: Oral Oral Oral Oral  SpO2: 100% 99% 99% 95%  Weight:      Height:        Intake/Output Summary (Last 24 hours) at 12/27/16 1142 Last data filed at 12/27/16 0825  Gross per 24 hour  Intake          2576.67 ml  Output              200 ml  Net          2376.67 ml   Filed Weights   12/26/16 1031  Weight: 83.9 kg (185 lb)    Examination:  Vitals:   12/26/16 1715 12/26/16 1734 12/26/16 2040 12/27/16 0525  BP: 135/80 (!) 153/82 136/82   Pulse: 83 86 86 77  Resp: 18 18 18 18   Temp: 99.6 F  (37.6 C) 99.6 F (37.6 C) 100.3 F (37.9 C) 98 F (36.7 C)  TempSrc: Oral Oral Oral Oral  SpO2: 100% 99% 99% 95%  Weight:      Height:        Constitutional: NAD Eyes: lids and conjunctivae normal Respiratory: clear to auscultation bilaterally, no wheezing, no crackles. Normal respiratory effort. No accessory muscle use.  Cardiovascular: Regular rate and rhythm, no murmurs / rubs / gallops. No LE edema. 2+ pedal pulses. No carotid bruits.  Abdomen: no tenderness. Bowel sounds positive.  Musculoskeletal: no clubbing / cyanosis.  GU: Enlarged left scrotum, tender to palpation.  Normal exam on right Skin: no rashes, lesions, ulcers. No induration Neurologic: CN 2-12 grossly intact. Strength 5/5 in all 4.    Data Reviewed: I have independently reviewed following labs and imaging studies  CBC:  Recent Labs Lab 12/26/16 1041 12/27/16 0536  WBC 15.1* 16.2*  HGB 13.8 13.3  HCT 40.7 39.3  MCV 89.8 88.9  PLT 199 203   Basic Metabolic Panel:  Recent Labs Lab 12/26/16 1041 12/27/16 0536  NA 135 138  K 4.0 3.9  CL 102 105  CO2 23 24  GLUCOSE 156* 102*  BUN 20 15  CREATININE 1.05 0.86  CALCIUM 10.3 9.9   GFR: Estimated  Creatinine Clearance: 105.8 mL/min (by C-G formula based on SCr of 0.86 mg/dL). Liver Function Tests:  Recent Labs Lab 12/26/16 1041 12/27/16 0536  AST 15 14*  ALT 13* 11*  ALKPHOS 77 71  BILITOT 1.1 1.3*  PROT 8.2* 7.4  ALBUMIN 3.5 3.1*    Recent Labs Lab 12/26/16 1041  LIPASE 29   No results for input(s): AMMONIA in the last 168 hours. Coagulation Profile: No results for input(s): INR, PROTIME in the last 168 hours. Cardiac Enzymes: No results for input(s): CKTOTAL, CKMB, CKMBINDEX, TROPONINI in the last 168 hours. BNP (last 3 results) No results for input(s): PROBNP in the last 8760 hours. HbA1C: No results for input(s): HGBA1C in the last 72 hours. CBG: No results for input(s): GLUCAP in the last 168 hours. Lipid Profile: No  results for input(s): CHOL, HDL, LDLCALC, TRIG, CHOLHDL, LDLDIRECT in the last 72 hours. Thyroid Function Tests: No results for input(s): TSH, T4TOTAL, FREET4, T3FREE, THYROIDAB in the last 72 hours. Anemia Panel: No results for input(s): VITAMINB12, FOLATE, FERRITIN, TIBC, IRON, RETICCTPCT in the last 72 hours. Urine analysis:    Component Value Date/Time   COLORURINE AMBER (A) 12/26/2016 1046   APPEARANCEUR CLOUDY (A) 12/26/2016 1046   LABSPEC >1.030 (H) 12/26/2016 1046   PHURINE 6.0 12/26/2016 1046   GLUCOSEU NEGATIVE 12/26/2016 1046   HGBUR LARGE (A) 12/26/2016 1046   BILIRUBINUR SMALL (A) 12/26/2016 1046   KETONESUR 15 (A) 12/26/2016 1046   PROTEINUR >300 (A) 12/26/2016 1046   NITRITE POSITIVE (A) 12/26/2016 1046   LEUKOCYTESUR TRACE (A) 12/26/2016 1046   Sepsis Labs: Invalid input(s): PROCALCITONIN, LACTICIDVEN  Recent Results (from the past 240 hour(s))  Blood culture (routine x 2)     Status: None (Preliminary result)   Collection Time: 12/26/16  1:53 PM  Result Value Ref Range Status   Specimen Description LEFT ANTECUBITAL  Final   Special Requests   Final    BOTTLES DRAWN AEROBIC AND ANAEROBIC Blood Culture adequate volume   Culture   Final    NO GROWTH < 24 HOURS Performed at [facility], [address] [place], [place] [address]    Report Status PENDING  Incomplete  Blood culture (routine x 2)     Status: None (Preliminary result)   Collection Time: 12/26/16  1:53 PM  Result Value Ref Range Status   Specimen Description BLOOD LEFT HAND  Final   Special Requests   Final    BOTTLES DRAWN AEROBIC AND ANAEROBIC Blood Culture adequate volume   Culture   Final    NO GROWTH < 24 HOURS Performed at [facility], [address] [place], [place] [address]    Report Status PENDING  Incomplete      Radiology Studies: [place] Scrotum  Result Date: 12/26/2016 CLINICAL DATA:  Acute left testicular pain. EXAM: SCROTAL ULTRASOUND DOPPLER ULTRASOUND OF THE  TESTICLES TECHNIQUE: Complete ultrasound examination of the testicles, epididymis, and other scrotal structures was performed. Color and spectral Doppler ultrasound were also utilized to evaluate blood flow to the testicles. COMPARISON:  None. FINDINGS: Right testicle Measurements: 4.6 x 2.7 x 2.7 cm. No mass visualized. Multiple small calcifications are noted suggesting microlithiasis. Left testicle Measurements: 5.1 x 3.3 x 3.2 cm. No mass visualized. Multiple small calcifications are noted suggesting microlithiasis. Right epididymis:  3 mm cyst is noted.  Otherwise normal. Left epididymis: Significantly enlarged with increased flow seen on Doppler suggesting epididymitis. Hydrocele:  Mild left hydrocele is noted. Varicocele:  None  visualized. Pulsed Doppler interrogation of both testes demonstrates normal low resistance arterial and venous waveforms bilaterally. IMPRESSION: Enlarged and hypervascular left epididymis is noted suggesting epididymitis. Mild left hydrocele is noted as well. No evidence of testicular mass or torsion. 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 Current literature suggests that testicular microlithiasis is not a significant independent risk factor for development of testicular carcinoma, and that follow up imaging is not warranted in the absence of other risk factors. Monthly testicular self-examination and annual physical exams are considered appropriate surveillance. If patient has other risk factors for testicular carcinoma, then referral to Urology should be considered. (Reference: DeCastro, et al.: A 5-Year Follow up Study of Asymptomatic Men with Testicular Microlithiasis. J Urol 2008; 179:1420-1423.). Electronically Signed   By: Lupita Raider, M.D.   On: 12/26/2016 12:19   [place] Art/ven Flow Abd Pelv Doppler  Result Date: 12/26/2016 CLINICAL DATA:  Acute left testicular pain. EXAM: SCROTAL ULTRASOUND DOPPLER ULTRASOUND OF THE TESTICLES TECHNIQUE: Complete ultrasound  examination of the testicles, epididymis, and other scrotal structures was performed. Color and spectral Doppler ultrasound were also utilized to evaluate blood flow to the testicles. COMPARISON:  None. FINDINGS: Right testicle Measurements: 4.6 x 2.7 x 2.7 cm. No mass visualized. Multiple small calcifications are noted suggesting microlithiasis. Left testicle Measurements: 5.1 x 3.3 x 3.2 cm. No mass visualized. Multiple small calcifications are noted suggesting microlithiasis. Right epididymis:  3 mm cyst is noted.  Otherwise normal. Left epididymis: Significantly enlarged with increased flow seen on Doppler suggesting epididymitis. Hydrocele:  Mild left hydrocele is noted. Varicocele:  None visualized. Pulsed Doppler interrogation of both testes demonstrates normal low resistance arterial and venous waveforms bilaterally. IMPRESSION: Enlarged and hypervascular left epididymis is noted suggesting epididymitis. Mild left hydrocele is noted as well. No evidence of testicular mass or torsion. 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 Current literature suggests that testicular microlithiasis is not a significant independent risk factor for development of testicular carcinoma, and that follow up imaging is not warranted in the absence of other risk factors. Monthly testicular self-examination and annual physical exams are considered appropriate surveillance. If patient has other risk factors for testicular carcinoma, then referral to Urology should be considered. (Reference: DeCastro, et al.: A 5-Year Follow up Study of Asymptomatic Men with Testicular Microlithiasis. J Urol 2008; 179:1420-1423.). Electronically Signed   By: Lupita Raider, M.D.   On: 12/26/2016 12:19     Scheduled Meds: . chlorhexidine  15 mL Mouth Rinse BID  . enoxaparin (LOVENOX) injection  40 mg Subcutaneous Q24H  . mouth rinse  15 mL Mouth Rinse q12n4p  . multivitamin with minerals  1 tablet Oral Daily  . nicotine  14 mg  Transdermal Daily  . thiamine  100 mg Oral Daily   Continuous Infusions: . sodium chloride 100 mL/hr at 12/27/16 0334  . cefTRIAXone (ROCEPHIN)  IV    . levofloxacin (LEVAQUIN) IV 500 mg (12/27/16 1018)    Pamella Pert, MD, PhD [facility] Pager [phone]  If 7PM-7AM, please contact night-coverage [email or web address] Password [facility] 12/27/2016, 11:42 AM

## 2016-12-27 NOTE — Progress Notes (Signed)
Pt had 4 beats VT.  Asymptomatic.  MD notified. Nino Parsleyark, Marylouise Mallet B

## 2016-12-27 NOTE — Care Management Note (Signed)
Case Management Note  Patient Details  Name: Brett Wood MRN: [id number] Date of Birth: 02/11/1961  Subjective/Objective: 56 y/o m admitted w/Acute epididymitis. From home. Provided w/health insurance info,community resource packet, pcp listing-encouraged [facility], [employer] med list. Patient states will need bus pass @ d/c-CSW notified.  No further CM needs.                 Action/Plan:d/c home.   Expected Discharge Date:   (unknown)               Expected Discharge Plan:  Home/Self Care  In-House Referral:     Discharge planning Services  CM Consult, [facility]igent [facility]  Post Acute Care Choice:    Choice offered to:     DME Arranged:    DME Agency:     HH Arranged:    HH Agency:     Status of Service:  In process, will continue to follow  If discussed at Long Length of Stay Meetings, dates discussed:    Additional Comments:  Lanier ClamMahabir, Sadie Hazelett, RN 12/27/2016, 12:24 PM

## 2016-12-28 DIAGNOSIS — N50819 Testicular pain, unspecified: Secondary | ICD-10-CM

## 2016-12-28 DIAGNOSIS — N451 Epididymitis: Principal | ICD-10-CM

## 2016-12-28 LAB — CBC
HCT: 37 % — ABNORMAL LOW (ref 39.0–52.0)
HEMOGLOBIN: 12.1 g/dL — AB (ref 13.0–17.0)
MCH: 29.9 pg (ref 26.0–34.0)
MCHC: 32.7 g/dL (ref 30.0–36.0)
MCV: 91.4 fL (ref 78.0–100.0)
Platelets: 210 10*3/uL (ref 150–400)
RBC: 4.05 MIL/uL — ABNORMAL LOW (ref 4.22–5.81)
RDW: 13.6 % (ref 11.5–15.5)
WBC: 17.6 10*3/uL — ABNORMAL HIGH (ref 4.0–10.5)

## 2016-12-28 LAB — URINE CULTURE: Culture: >=100000 — AB

## 2016-12-28 LAB — BASIC METABOLIC PANEL
ANION GAP: 9 (ref 5–15)
BUN: 12 mg/dL (ref 6–20)
CALCIUM: 9.6 mg/dL (ref 8.9–10.3)
CO2: 26 mmol/L (ref 22–32)
CREATININE: 0.84 mg/dL (ref 0.61–1.24)
Chloride: 101 mmol/L (ref 101–111)
GFR calc Af Amer: >60 mL/min (ref >60)
GFR calc non Af Amer: >60 mL/min (ref >60)
GLUCOSE: 98 mg/dL (ref 65–99)
Potassium: 3.6 mmol/L (ref 3.5–5.1)
Sodium: 136 mmol/L (ref 135–145)

## 2016-12-28 MED ORDER — LEVOFLOXACIN 750 MG PO TABS: 750.0000 mg | ORAL_TABLET | Freq: Every day | ORAL | 0 refills | Status: AC

## 2016-12-28 MED ORDER — ACETAMINOPHEN 325 MG PO TABS: 650.0000 mg | ORAL_TABLET | Freq: Four times a day (QID) | ORAL | Status: DC | PRN

## 2016-12-28 NOTE — Discharge Summary (Signed)
Physician Discharge Summary  Brett Wood ZOX:[id number] DOB: [birth year] DOA: 12/26/2016  PCP: Lavinia Sharps, NP  Admit date: 12/26/2016 Discharge date: 12/28/2016  Admitted From:home Disposition:home  Recommendations for Outpatient Follow-up:  1. Follow up with PCP in 1-2 weeks 2. Please obtain BMP/CBC in one week   Home Health:no Equipment/Devices:no Discharge Condition:stable CODE STATUS:full Diet recommendation:heart healthy  Brief/Interim Summary: 56 year old male with no significant past medical history presented with burning urination for a few days associated with the swelling of the left scrotum. Patient also complaining of subjective fever and chills nausea vomiting and poor appetite. Patient was found to have acute epididymitis/UTI. The urine culture growing Escherichia coli which is pansensitive. Patient was treated with a dose of ceftriaxone and Levaquin. The patient clinically improved. Afebrile. WBC is mildly elevated however patient is clinically improved. Denied nausea vomiting and abdominal pain. At this time patient is clinically stable to discharge home with oral Levaquin to complete total of 10 days course. Education provided the patient to quit smoking. Verbalized understanding. Recommended to follow up with PCP.  Ultrasound and Doppler of scrotum suggesting epididymitis and mild left hydrocele. No evidence of testicular torsion or mass.  Discharge Diagnoses:  Principal Problem:   Acute epididymitis Active Problems:   Acute lower UTI    Discharge Instructions  Discharge Instructions    Call MD for:  difficulty breathing, headache or visual disturbances    Complete by:  As directed    Call MD for:  extreme fatigue    Complete by:  As directed    Call MD for:  hives    Complete by:  As directed    Call MD for:  persistant dizziness or light-headedness    Complete by:  As directed    Call MD for:  persistant nausea and vomiting    Complete by:  As  directed    Call MD for:  severe uncontrolled pain    Complete by:  As directed    Call MD for:  temperature >100.4    Complete by:  As directed    Diet - low sodium heart healthy    Complete by:  As directed    Increase activity slowly    Complete by:  As directed      Allergies as of 12/28/2016   No Known Allergies     Medication List    TAKE these medications   acetaminophen 325 MG tablet Commonly known as:  TYLENOL Take 2 tablets (650 mg total) by mouth every 6 (six) hours as needed for mild pain (or Fever >/= 101).   levofloxacin 750 MG tablet Commonly known as:  LEVAQUIN Take 1 tablet (750 mg total) by mouth daily.      Follow-up Information    Placey, Chales Abrahams, NP. Schedule an appointment as soon as possible for a visit in 1 week(s).   Contact information: [address] [place] [address] [phone]          No Known Allergies  Consultations: None  Procedures/Studies: None  Subjective: Seen and examined at bedside. Reported doing well. Denied headache, disease, nausea, vomiting, chest pain, shortness of breath.  Discharge Exam: Vitals:   12/27/16 2232 12/28/16 0524  BP: 126/77 126/80  Pulse: 75 67  Resp: 18 20  Temp: 98 F (36.7 C) 98.7 F (37.1 C)   Vitals:   12/27/16 0525 12/27/16 1355 12/27/16 2232 12/28/16 0524  BP:  133/73 126/77 126/80  Pulse: 77 81 75 67  Resp: 18  18 18 20   Temp: 98 F (36.7 C) 100.2 F (37.9 C) 98 F (36.7 C) 98.7 F (37.1 C)  TempSrc: Oral Oral Oral Oral  SpO2: 95% 99% 97% 98%  Weight:      Height:        General: Pt is alert, awake, not in acute distress Cardiovascular: RRR, S1/S2 +, no rubs, no gallops Respiratory: CTA bilaterally, no wheezing, no rhonchi Abdominal: Soft, NT, ND, bowel sounds + Extremities: no edema, no cyanosis Scrotum, mild swelling. Better than before as per patient. Not tender.   The results of significant diagnostics from this hospitalization (including imaging,  microbiology, ancillary and laboratory) are listed below for reference.     Microbiology: Recent Results (from the past 240 hour(s))  Urine culture     Status: Abnormal   Collection Time: 12/26/16 10:46 AM  Result Value Ref Range Status   Specimen Description URINE, CLEAN CATCH  Final   Special Requests NONE  Final   Culture >=100,000 COLONIES/mL ESCHERICHIA COLI (A)  Final   Report Status 12/28/2016 FINAL  Final   Organism ID, Bacteria ESCHERICHIA COLI (A)  Final      Susceptibility   Escherichia coli - MIC*    AMPICILLIN <=2 SENSITIVE Sensitive     CEFAZOLIN <=4 SENSITIVE Sensitive     CEFTRIAXONE <=1 SENSITIVE Sensitive     CIPROFLOXACIN <=0.25 SENSITIVE Sensitive     GENTAMICIN <=1 SENSITIVE Sensitive     IMIPENEM <=0.25 SENSITIVE Sensitive     NITROFURANTOIN <=16 SENSITIVE Sensitive     TRIMETH/SULFA <=20 SENSITIVE Sensitive     AMPICILLIN/SULBACTAM <=2 SENSITIVE Sensitive     PIP/TAZO <=4 SENSITIVE Sensitive     Extended ESBL NEGATIVE Sensitive     * >=100,000 COLONIES/mL ESCHERICHIA COLI  Blood culture (routine x 2)     Status: None (Preliminary result)   Collection Time: 12/26/16  1:53 PM  Result Value Ref Range Status   Specimen Description LEFT ANTECUBITAL  Final   Special Requests   Final    BOTTLES DRAWN AEROBIC AND ANAEROBIC Blood Culture adequate volume   Culture   Final    NO GROWTH 2 DAYS Performed at [facility], [address], [place], [place] [address]    Report Status PENDING  Incomplete  Blood culture (routine x 2)     Status: None (Preliminary result)   Collection Time: 12/26/16  1:53 PM  Result Value Ref Range Status   Specimen Description BLOOD LEFT HAND  Final   Special Requests   Final    BOTTLES DRAWN AEROBIC AND ANAEROBIC Blood Culture adequate volume   Culture   Final    NO GROWTH 2 DAYS Performed at [facility], [address] [place], [place] [address]    Report Status PENDING  Incomplete     Labs: BNP (last 3  results) No results for input(s): BNP in the last 8760 hours. Basic Metabolic Panel:  Recent Labs Lab 12/26/16 1041 12/27/16 0536 12/28/16 0454  NA 135 138 136  K 4.0 3.9 3.6  CL 102 105 101  CO2 23 24 26   GLUCOSE 156* 102* 98  BUN 20 15 12   CREATININE 1.05 0.86 0.84  CALCIUM 10.3 9.9 9.6   Liver Function Tests:  Recent Labs Lab 12/26/16 1041 12/27/16 0536  AST 15 14*  ALT 13* 11*  ALKPHOS 77 71  BILITOT 1.1 1.3*  PROT 8.2* 7.4  ALBUMIN 3.5 3.1*    Recent Labs Lab 12/26/16 1041  LIPASE 29   No results for input(s): AMMONIA in the last 168 hours. CBC:  Recent Labs Lab 12/26/16 1041 12/27/16 0536 12/28/16 0454  WBC 15.1* 16.2* 17.6*  HGB 13.8 13.3 12.1*  HCT 40.7 39.3 37.0*  MCV 89.8 88.9 91.4  PLT 199 203 210   Cardiac Enzymes: No results for input(s): CKTOTAL, CKMB, CKMBINDEX, TROPONINI in the last 168 hours. BNP: Invalid input(s): POCBNP CBG: No results for input(s): GLUCAP in the last 168 hours. D-Dimer No results for input(s): DDIMER in the last 72 hours. Hgb A1c No results for input(s): HGBA1C in the last 72 hours. Lipid Profile No results for input(s): CHOL, HDL, LDLCALC, TRIG, CHOLHDL, LDLDIRECT in the last 72 hours. Thyroid function studies No results for input(s): TSH, T4TOTAL, T3FREE, THYROIDAB in the last 72 hours.  Invalid input(s): FREET3 Anemia work up No results for input(s): VITAMINB12, FOLATE, FERRITIN, TIBC, IRON, RETICCTPCT in the last 72 hours. Urinalysis    Component Value Date/Time   COLORURINE AMBER (A) 12/26/2016 1046   APPEARANCEUR CLOUDY (A) 12/26/2016 1046   LABSPEC >1.030 (H) 12/26/2016 1046   PHURINE 6.0 12/26/2016 1046   GLUCOSEU NEGATIVE 12/26/2016 1046   HGBUR LARGE (A) 12/26/2016 1046   BILIRUBINUR SMALL (A) 12/26/2016 1046   KETONESUR 15 (A) 12/26/2016 1046   PROTEINUR >300 (A) 12/26/2016 1046   NITRITE POSITIVE (A) 12/26/2016 1046   LEUKOCYTESUR TRACE (A) 12/26/2016 1046   Sepsis Labs Invalid  input(s): PROCALCITONIN,  WBC,  LACTICIDVEN Microbiology Recent Results (from the past 240 hour(s))  Urine culture     Status: Abnormal   Collection Time: 12/26/16 10:46 AM  Result Value Ref Range Status   Specimen Description URINE, CLEAN CATCH  Final   Special Requests NONE  Final   Culture >=100,000 COLONIES/mL ESCHERICHIA COLI (A)  Final   Report Status 12/28/2016 FINAL  Final   Organism ID, Bacteria ESCHERICHIA COLI (A)  Final      Susceptibility   Escherichia coli - MIC*    AMPICILLIN <=2 SENSITIVE Sensitive     CEFAZOLIN <=4 SENSITIVE Sensitive     CEFTRIAXONE <=1 SENSITIVE Sensitive     CIPROFLOXACIN <=0.25 SENSITIVE Sensitive     GENTAMICIN <=1 SENSITIVE Sensitive     IMIPENEM <=0.25 SENSITIVE Sensitive     NITROFURANTOIN <=16 SENSITIVE Sensitive     TRIMETH/SULFA <=20 SENSITIVE Sensitive     AMPICILLIN/SULBACTAM <=2 SENSITIVE Sensitive     PIP/TAZO <=4 SENSITIVE Sensitive     Extended ESBL NEGATIVE Sensitive     * >=100,000 COLONIES/mL ESCHERICHIA COLI  Blood culture (routine x 2)     Status: None (Preliminary result)   Collection Time: 12/26/16  1:53 PM  Result Value Ref Range Status   Specimen Description LEFT ANTECUBITAL  Final   Special Requests   Final    BOTTLES DRAWN AEROBIC AND ANAEROBIC Blood Culture adequate volume   Culture   Final    NO GROWTH 2 DAYS Performed at [facility], [address] [place], [place] [id number]    Report Status PENDING  Incomplete  Blood culture (routine x 2)     Status: None (Preliminary result)   Collection Time: 12/26/16  1:53 PM  Result Value Ref Range Status   Specimen Description BLOOD LEFT HAND  Final   Special Requests   Final    BOTTLES DRAWN AEROBIC AND ANAEROBIC Blood Culture adequate volume   Culture   Final    NO GROWTH 2 DAYS Performed at [facility],  [address], [place], [place] [address]    Report Status PENDING  Incomplete     Time coordinating discharge: 27 minutes  SIGNED:   Maxie Barb, MD  [facility] 12/28/2016, 12:47 PM  If 7PM-7AM, please contact night-coverage [email or web address] Password [email or web address]

## 2016-12-30 LAB — GC/CHLAMYDIA PROBE AMP (~~LOC~~) NOT AT ARMC
Chlamydia: NEGATIVE
Neisseria Gonorrhea: NEGATIVE

## 2016-12-31 LAB — CULTURE, BLOOD (ROUTINE X 2)
CULTURE: NO GROWTH
CULTURE: NO GROWTH
SPECIAL REQUESTS: ADEQUATE
Special Requests: ADEQUATE

## 2017-01-01 MED FILL — ?LEVOFLOXACIN 750 MG TAB: 750 | 8 days supply | Qty: 8 | Fill #0

## 2018-10-19 ENCOUNTER — Encounter (HOSPITAL_COMMUNITY): Payer: Self-pay | Admitting: *Deleted

## 2018-10-19 ENCOUNTER — Emergency Department (HOSPITAL_COMMUNITY)
Admission: EM | Admit: 2018-10-19 | Discharge: 2018-10-19 | Disposition: A | Discharge status: Home / Self Care | Payer: Self-pay | Attending: Emergency Medicine | Admitting: Emergency Medicine

## 2018-10-19 ENCOUNTER — Other Ambulatory Visit: Payer: Self-pay

## 2018-10-19 DIAGNOSIS — H1013 Acute atopic conjunctivitis, bilateral: Secondary | ICD-10-CM | POA: Insufficient documentation

## 2018-10-19 DIAGNOSIS — F17210 Nicotine dependence, cigarettes, uncomplicated: Secondary | ICD-10-CM | POA: Insufficient documentation

## 2018-10-19 MED ORDER — ERYTHROMYCIN 5 MG/GM OP OINT: TOPICAL_OINTMENT | OPHTHALMIC | 0 refills | Status: DC

## 2018-10-19 MED ORDER — LORATADINE 10 MG PO TABS: 10.0000 mg | ORAL_TABLET | Freq: Every day | ORAL | 0 refills | Status: DC

## 2018-10-19 NOTE — ED Provider Notes (Signed)
[facility] EMERGENCY DEPARTMENT Provider Note   CSN: [id number] Arrival date & time: 10/19/18  1259    History   Chief Complaint Chief Complaint  Patient presents with  . Eye Problem    HPI Delquan Huibregtse is a 58 y.o. male presenting today for bilateral eye redness, burning and drainage.  Patient states that over the past 2 days he has had a mild burning sensation to both eyes constant worsened with rubbing his eyes and only temporarily relieved with eyedrops.  He denies any vision changes or pain with eye movement.  Patient states that he has had similar pain in the past whenever the "pollen gets bad" he has not taken any medication aside from lubricant drops for his symptoms.  Patient does endorse a watery teary-like drainage from his eyes, he denies any purulent drainage.  Patient endorses associated symptoms of rhinorrhea.  Additionally patient denies any fever, facial swelling, sore throat/difficulty swallowing, cough, shortness of breath or any additional concerns.    HPI  Past Medical History:  Diagnosis Date  . Allergic     Patient Active Problem List   Diagnosis Date Noted  . Testicular pain   . Acute lower UTI 12/26/2016  . Acute epididymitis 12/26/2016    History reviewed. No pertinent surgical history.      Home Medications    Prior to Admission medications   Medication Sig Start Date End Date Taking? Authorizing Provider  acetaminophen (TYLENOL) 325 MG tablet Take 2 tablets (650 mg total) by mouth every 6 (six) hours as needed for mild pain (or Fever >/= 101). 12/28/16   Maxie Barb, MD  erythromycin ophthalmic ointment Place a 1/2 inch ribbon of ointment into the lower eyelid of both eyes 4 times daily for 7 days 10/19/18   Harlene Salts A, PA-C  loratadine (CLARITIN) 10 MG tablet Take 1 tablet (10 mg total) by mouth daily. 10/19/18   Bill Salinas, PA-C    Family History No family history on file.  Social History Social  History   Tobacco Use  . Smoking status: Current Every Day Smoker    Packs/day: 0.25    Types: Cigarettes  . Smokeless tobacco: Never Used  Substance Use Topics  . Alcohol use: Not Currently  . Drug use: Not Currently    Types: Marijuana, Cocaine    Comment: former drug user     Allergies   Patient has no known allergies.   Review of Systems Review of Systems  Constitutional: Negative.  Negative for chills, diaphoresis and fever.  HENT: Positive for rhinorrhea. Negative for congestion, facial swelling, sore throat, trouble swallowing and voice change.   Eyes: Positive for pain (Mild burning), discharge (Watery), redness (Conjunctival) and itching. Negative for photophobia and visual disturbance.  Respiratory: Negative.  Negative for cough and shortness of breath.   Cardiovascular: Negative.  Negative for chest pain.  Gastrointestinal: Negative.  Negative for abdominal pain, nausea and vomiting.  Neurological: Negative.  Negative for weakness and headaches.   Physical Exam Updated Vital Signs BP 129/73 (BP Location: Left Arm)   Pulse 64   Temp 98 F (36.7 C) (Oral)   Resp 16   Ht 5\' 11"  (1.803 m)   Wt 83.9 kg   SpO2 98%   BMI 25.80 kg/m   Physical Exam Constitutional:      General: He is not in acute distress.    Appearance: Normal appearance. He is well-developed. He is not ill-appearing or diaphoretic.  HENT:  Head: Normocephalic and atraumatic. No raccoon eyes or Battle's sign.     Jaw: There is normal jaw occlusion. No trismus.     Right Ear: Tympanic membrane, ear canal and external ear normal.     Left Ear: Tympanic membrane, ear canal and external ear normal.     Nose: Rhinorrhea present. Rhinorrhea is clear.     Right Sinus: No maxillary sinus tenderness or frontal sinus tenderness.     Left Sinus: No maxillary sinus tenderness or frontal sinus tenderness.     Mouth/Throat:     Mouth: Mucous membranes are moist.     Pharynx: Oropharynx is clear. Uvula  midline.  Eyes:     General: Vision grossly intact. Gaze aligned appropriately. Allergic shiner present.     Extraocular Movements: Extraocular movements intact.     Pupils: Pupils are equal, round, and reactive to light.     Comments: Bilateral eyes with mild conjunctival erythema present.  No scleral icterus present.  Mild thin tearing discharge present.  No limbal erythema.  Pupils equal round reactive light and accommodating.  Extraocular motion intact without pain or nystagmus.  No photophobia or consensual photophobia.  No foreign bodies seen.  Anterior chambers appear clear bilaterally.  Visual fields grossly intact bilaterally.  Mild allergic shiners are present bilaterally.  Neck:     Musculoskeletal: Full passive range of motion without pain, normal range of motion and neck supple.     Trachea: Trachea and phonation normal. No tracheal tenderness or tracheal deviation.  Pulmonary:     Effort: Pulmonary effort is normal. No respiratory distress.     Breath sounds: Normal breath sounds and air entry.  Abdominal:     General: There is no distension.     Palpations: Abdomen is soft.     Tenderness: There is no abdominal tenderness. There is no guarding or rebound.  Musculoskeletal: Normal range of motion.  Skin:    General: Skin is warm and dry.  Neurological:     Mental Status: He is alert.     GCS: GCS eye subscore is 4. GCS verbal subscore is 5. GCS motor subscore is 6.     Comments: Speech is clear and goal oriented, follows commands Major Cranial nerves without deficit, no facial droop Moves extremities without ataxia, coordination intact Normal gait.  Psychiatric:        Behavior: Behavior normal.    ED Treatments / Results  Labs (all labs ordered are listed, but only abnormal results are displayed) Labs Reviewed - No data to display  EKG None  Radiology No results found.  Procedures Procedures (including critical care time)  Medications Ordered in ED  Medications - No data to display   Initial Impression / Assessment and Plan / ED Course  I have reviewed the triage vital signs and the nursing notes.  Pertinent labs & imaging results that were available during my care of the patient were reviewed by me and considered in my medical decision making (see chart for details).    58 year old male presenting today for 2-day history of bilateral eye redness, itching, burning pain with watery drainage.  No purulent discharge, history of trauma, entrapment, photophobia/consensual photophobia.  He denies any visual changes and states that his vision is at his baseline.  There are no signs concerning for iritis, bacterial conjunctivitis, preseptal/orbital cellulitis, hyphema/hypopyon or other acute ocular emergencies.  Patient with what appears to be allergic conjunctivitis and allergic rhinorrhea.  There are no signs of sinusitis at  this time.  We will treat patient with Claritin for his allergies.  Additionally cannot rule out viral conjunctivitis at this time, will cover patient prophylactically with erythromycin and encouraged ophthalmology follow-up.  Personal hygiene and frequent handwashing was discussed.  Patient advised to follow-up with ophthalmology and primary care provider.  Encouraged to return immediately to the ED for new or worsening symptoms including vision changes or purulent discharge.  At this time there does not appear to be any evidence of an acute emergency medical condition and the patient appears stable for discharge with appropriate outpatient follow up. Diagnosis was discussed with patient who verbalizes understanding of care plan and is agreeable to discharge. I have discussed return precautions with patient who verbalizes understanding of return precautions. Patient encouraged to follow-up with their PCP and ophthalmology. All questions answered.  She has been discharged in good condition.  Patient's case discussed with Dr. Jacqulyn Bath  who agrees with work-up, plan to discharge and follow-up.  Note: Portions of this report may have been transcribed using voice recognition software. Every effort was made to ensure accuracy; however, inadvertent computerized transcription errors may still be present. Final Clinical Impressions(s) / ED Diagnoses   Final diagnoses:  Allergic conjunctivitis of both eyes    ED Discharge Orders         Ordered    erythromycin ophthalmic ointment     10/19/18 1444    loratadine (CLARITIN) 10 MG tablet  Daily     10/19/18 1444           Elizabeth Palau 10/19/18 1458    Maia Plan, MD 10/19/18 1731

## 2018-10-19 NOTE — Discharge Instructions (Signed)
You have been diagnosed today with Allergic Conjunctivitis.  At this time there does not appear to be the presence of an emergent medical condition, however there is always the potential for conditions to change. Please read and follow the below instructions.  Please return to the Emergency Department immediately for any new or worsening symptoms. Please be sure to follow up with your Primary Care Provider within one week regarding your visit today; please call their office to schedule an appointment even if you are feeling better for a follow-up visit. Suspect that your symptoms today are caused by your allergies however a virus may also be the cause, please use the antibiotic eyedrop erythromycin 4 times daily in each eye as prescribed for the next 7 days to avoid infection.  Additionally you may use the allergy medication Claritin as prescribed to help with your allergies.  You may continue using your over-the-counter lubricant eyedrops as directed on the packaging.  Please call the eye doctor, Dr. Dione Booze, today to schedule a follow-up appointment sometime this week.  Get help right away if: You have redness, swelling, or other symptoms in only one eye. Your vision is blurry. You have vision changes. You have very bad eye pain. Contact a health care provider if: Your symptoms do not improve with treatment or they get worse. You have increased pain. Your vision becomes blurry. You have a fever. You have facial pain, redness, or swelling. You have yellow or green drainage coming from your eye. You have new symptoms.  Please read the additional information packets attached to your discharge summary.  Do not take your medicine if  develop an itchy rash, swelling in your mouth or lips, or difficulty breathing.

## 2018-10-19 NOTE — ED Triage Notes (Signed)
C/o burning to both eyes and red and draining. Onset 2 days ago

## 2018-10-19 NOTE — ED Notes (Signed)
Patient verbalizes understanding of discharge instructions . Opportunity for questions and answers were provided . Armband removed by staff ,Pt discharged from ED. W/C  offered at D/C  and Declined W/C at D/C and was escorted to lobby by RN.  

## 2019-06-23 ENCOUNTER — Emergency Department (HOSPITAL_COMMUNITY): Payer: Self-pay

## 2019-06-23 ENCOUNTER — Other Ambulatory Visit: Payer: Self-pay

## 2019-06-23 ENCOUNTER — Emergency Department (HOSPITAL_COMMUNITY)
Admission: EM | Admit: 2019-06-23 | Discharge: 2019-06-23 | Disposition: A | Discharge status: Home / Self Care | Payer: Self-pay | Attending: Emergency Medicine | Admitting: Emergency Medicine

## 2019-06-23 ENCOUNTER — Encounter (HOSPITAL_COMMUNITY): Payer: Self-pay | Admitting: Emergency Medicine

## 2019-06-23 DIAGNOSIS — F17210 Nicotine dependence, cigarettes, uncomplicated: Secondary | ICD-10-CM | POA: Insufficient documentation

## 2019-06-23 DIAGNOSIS — G8929 Other chronic pain: Secondary | ICD-10-CM | POA: Insufficient documentation

## 2019-06-23 DIAGNOSIS — M1711 Unilateral primary osteoarthritis, right knee: Secondary | ICD-10-CM | POA: Insufficient documentation

## 2019-06-23 MED ORDER — NAPROXEN 250 MG PO TABS
500.0000 mg | ORAL_TABLET | Freq: Once | ORAL | Status: AC
  Administered 2019-06-23: 15:00:00 500 mg via ORAL
  Filled 2019-06-23: qty 2

## 2019-06-23 MED ORDER — NAPROXEN 500 MG PO TABS: 500.0000 mg | ORAL_TABLET | Freq: Two times a day (BID) | ORAL | 0 refills | Status: DC

## 2019-06-23 NOTE — Discharge Instructions (Signed)
Your xray shows arthritis in your knee. This is likely causing your symptoms. Please read instructions below. Apply ice to your knee for 20 minutes at a time. Elevate it to help with swelling. You can take naproxen every 12 hours as needed for pain. Schedule an appointment with primary care for further management. Return to the ER for new or concerning symptoms.

## 2019-06-23 NOTE — ED Provider Notes (Signed)
[facility] EMERGENCY DEPARTMENT Provider Note   CSN: [id number] Arrival date & time: 06/23/19  1317     History   Chief Complaint Chief Complaint  Patient presents with  . Knee Pain    HPI Brett Wood is a 58 y.o. male presenting to the emergency department with complaint of chronic intermittent right knee pain that has been ongoing for multiple years.  He states he has not been evaluated for this in the past.  His symptoms are worse when he is on his feet for long periods of time.  He describes his pain as an aching pain across the anterior aspect.  He has waxing and waning swelling as well.  Pain is better with rest.  He has not treated his symptoms with any medications.  No fevers or redness.  No history of gout.  No injuries.     The history is provided by the patient.    Past Medical History:  Diagnosis Date  . Allergic     Patient Active Problem List   Diagnosis Date Noted  . Testicular pain   . Acute lower UTI 12/26/2016  . Acute epididymitis 12/26/2016    History reviewed. No pertinent surgical history.      Home Medications    Prior to Admission medications   Medication Sig Start Date End Date Taking? Authorizing Provider  acetaminophen (TYLENOL) 325 MG tablet Take 2 tablets (650 mg total) by mouth every 6 (six) hours as needed for mild pain (or Fever >/= 101). 12/28/16   Maxie Barb, MD  erythromycin ophthalmic ointment Place a 1/2 inch ribbon of ointment into the lower eyelid of both eyes 4 times daily for 7 days 10/19/18   Harlene Salts A, PA-C  loratadine (CLARITIN) 10 MG tablet Take 1 tablet (10 mg total) by mouth daily. 10/19/18   Harlene Salts A, PA-C  naproxen (NAPROSYN) 500 MG tablet Take 1 tablet (500 mg total) by mouth 2 (two) times daily with a meal. 06/23/19   Greycen Felter, [place] N, PA-C    Family History No family history on file.  Social History Social History   Tobacco Use  . Smoking status: Current Every Day  Smoker    Packs/day: 0.25    Types: Cigarettes  . Smokeless tobacco: Never Used  Substance Use Topics  . Alcohol use: Not Currently  . Drug use: Not Currently    Types: Marijuana, Cocaine    Comment: former drug user     Allergies   Patient has no known allergies.   Review of Systems Review of Systems  Constitutional: Negative for fever.  Musculoskeletal: Positive for arthralgias.     Physical Exam Updated Vital Signs BP (!) 149/103 (BP Location: Right Arm)   Pulse 71   Temp 98.3 F (36.8 C) (Oral)   Resp 16   SpO2 99%   Physical Exam Vitals signs and nursing note reviewed.  Constitutional:      General: He is not in acute distress.    Appearance: He is well-developed. He is not ill-appearing.  HENT:     Head: Normocephalic and atraumatic.  Eyes:     Conjunctiva/sclera: Conjunctivae normal.  Cardiovascular:     Rate and Rhythm: Normal rate.  Pulmonary:     Effort: Pulmonary effort is normal.  Musculoskeletal:     Comments: Right knee with swelling and generalized anterior tenderness.  No redness.  Very slight warmth compared to the left.  Full normal range of motion.  Knee is  stable.  No distal swelling present.  Neurological:     Mental Status: He is alert.  Psychiatric:        Mood and Affect: Mood normal.        Behavior: Behavior normal.      ED Treatments / Results  Labs (all labs ordered are listed, but only abnormal results are displayed) Labs Reviewed - No data to display  EKG None  Radiology Dg Knee Complete 4 Views Right  Result Date: 06/23/2019 CLINICAL DATA:  Chronic right knee pain with difficulty standing and walking. EXAM: RIGHT KNEE - COMPLETE 4+ VIEW COMPARISON:  None. FINDINGS: Mild tricompartmental osteoarthritis worse over the medial compartment. No fracture or dislocation. Suggestion of small joint effusion. IMPRESSION: 1.  No acute fracture. 2. 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 Small joint  effusion. Electronically Signed   By: Marin Olp M.D.   On: 06/23/2019 14:52    Procedures Procedures (including critical care time)  Medications Ordered in ED Medications  naproxen (NAPROSYN) tablet 500 mg (has no administration in time range)     Initial Impression / Assessment and Plan / ED Course  I have reviewed the triage vital signs and the nursing notes.  Pertinent labs & imaging results that were available during my care of the patient were reviewed by me and considered in my medical decision making (see chart for details).        Patient with chronic right knee pain with waxing and waning swelling over the last couple of years.  No trauma.  Exam is reassuring, joint is not red or hot to suggest septic joint or gout.  Afebrile.  Normal range of motion.  X-rays consistent with tricompartmental osteoarthritis.  Patient provided knee sleeve for compression and support.  Instructed NSAIDs, ice, elevation, and PCP follow-up.  Agreeable to plan and safe for discharge.  Discussed results, findings, treatment and follow up. Patient advised of return precautions. Patient verbalized understanding and agreed with plan.   Final Clinical Impressions(s) / ED Diagnoses   Final diagnoses:  Osteoarthritis of right knee, unspecified osteoarthritis type    ED Discharge Orders         Ordered    naproxen (NAPROSYN) 500 MG tablet  2 times daily with meals     06/23/19 1510           Amonda Brillhart, [place] N, [place] 06/23/19 [place], Ankit, MD 06/25/19 1931

## 2019-06-23 NOTE — ED Triage Notes (Signed)
C/o chronic R knee pain for years.  States he has been walking a lot and knee is aching.  He believes it is arthritis.

## 2019-06-23 NOTE — ED Notes (Signed)
Patient verbalizes understanding of discharge instructions. Opportunity for questioning and answers were provided. Armband removed by staff, pt discharged from ED.  

## 2019-08-26 ENCOUNTER — Ambulatory Visit: Payer: Self-pay | Admitting: Family Medicine

## 2019-08-31 ENCOUNTER — Ambulatory Visit: Payer: Self-pay | Attending: Internal Medicine | Admitting: Internal Medicine

## 2019-08-31 ENCOUNTER — Other Ambulatory Visit: Payer: Self-pay

## 2019-08-31 ENCOUNTER — Encounter: Payer: Self-pay | Admitting: Internal Medicine

## 2019-08-31 DIAGNOSIS — F17200 Nicotine dependence, unspecified, uncomplicated: Secondary | ICD-10-CM | POA: Insufficient documentation

## 2019-08-31 DIAGNOSIS — M1711 Unilateral primary osteoarthritis, right knee: Secondary | ICD-10-CM | POA: Insufficient documentation

## 2019-08-31 MED ORDER — VARENICLINE TARTRATE 0.5 MG PO TABS: 0.5000 mg | ORAL_TABLET | Freq: Two times a day (BID) | ORAL | 0 refills | Status: DC

## 2019-08-31 MED ORDER — MELOXICAM 15 MG PO TABS: 15.0000 mg | ORAL_TABLET | Freq: Every day | ORAL | 0 refills | Status: DC

## 2019-08-31 MED ORDER — MELOXICAM 15 MG PO TABS: 15.0000 mg | ORAL_TABLET | Freq: Every day | ORAL | 2 refills | Status: AC

## 2019-08-31 MED ORDER — VARENICLINE TARTRATE 0.5 MG PO TABS: 0.5000 mg | ORAL_TABLET | Freq: Two times a day (BID) | ORAL | 0 refills | Status: AC

## 2019-08-31 MED FILL — MELOXICAM 15 MG TABLET: 15 | 30 days supply | Qty: 30 | Fill #0

## 2019-08-31 NOTE — Progress Notes (Signed)
Pt is requesting something for pain for his knee  Pt states he has arthritis in his right knee

## 2019-08-31 NOTE — Progress Notes (Signed)
Virtual Visit via Telephone Note Due to current restrictions/limitations of in-office visits due to the COVID-19 pandemic, this scheduled clinical appointment was converted to a telehealth visit  I connected with Brett Wood on 08/31/19 at 2:56 p.m by telephone and verified that I am speaking with the correct person using two identifiers. I am in my office.  The patient is at home.  Only the patient and myself participated in this encounter.  I discussed the limitations, risks, security and privacy concerns of performing an evaluation and management service by telephone and the availability of in person appointments. I also discussed with the patient that there may be a patient responsible charge related to this service. The patient expressed understanding and agreed to proceed.   History of Present Illness: History of tobacco dependence, substance abuse in remission and osteoarthritis of the right knee.  The purpose of today's visit is to establish care with [place].  Pt denies having any previous PCP. Main concern today is pain in RT knee due to arthritis. Pain is worse when he does a lot of walk. He has to stop and sit for a while.  Swells sometimes.  No falls.  Not currently taking anything for it. Seen in ER 06/2019 and this showed tricompartmental OA worse over medial aspect.   Tob dep: smoke 5 cig/day.  Smoked for 20 yrs. Never quit before.  Wants to quit be not sure what it will take.    Social History   Tobacco Use  . Smoking status: Current Every Day Smoker    Packs/day: 0.25    Types: Cigarettes  . Smokeless tobacco: Never Used  Substance Use Topics  . Alcohol use: Not Currently  . Drug use: Not Currently    Types: Marijuana, Cocaine    Comment: former drug user   Patient denies any family history. No past surgeries.   Observations/Objective: No direct observation done as this was a telephone encounter.    Chemistry      Component Value Date/Time   NA 136 12/28/2016 0454    K 3.6 12/28/2016 0454   CL 101 12/28/2016 0454   CO2 26 12/28/2016 0454   BUN 12 12/28/2016 0454   CREATININE 0.84 12/28/2016 0454      Component Value Date/Time   CALCIUM 9.6 12/28/2016 0454   ALKPHOS 71 12/27/2016 0536   AST 14 (L) 12/27/2016 0536   ALT 11 (L) 12/27/2016 0536   BILITOT 1.3 (H) 12/27/2016 0536      Assessment and Plan: 1. Primary osteoarthritis of right knee Prescribe meloxicam for him to use daily.  Advised not to take any over-the-counter medications while on this medicine. We will have him do an in person visit with [place] in about 7 weeks - meloxicam (MOBIC) 15 MG tablet; Take 1 tablet (15 mg total) by mouth daily.  Dispense: 30 tablet; Refill: 0  2. Tobacco dependence Advised to quit.  Discussed health risks associated with smoking.  He is ready to give a trial of quitting.  Discussed methods to help him quit.  He is interested in trying the Chantix.  I went over possible side effects of the medications including bad dreams and mood swings.  He will let me know if he experience any of this.  Less than 5 minutes spent on counseling. - varenicline (CHANTIX) 0.5 MG tablet; Take 1 tablet (0.5 mg total) by mouth 2 (two) times daily.  Dispense: 60 tablet; Refill: 0  Of note, patient's blood pressure was elevated on ER  visit 12/20.  We will have him follow-up with [place] in several weeks in person for blood pressure check and to see how he is doing on medications. Follow Up Instructions: 7 wks   I discussed the assessment and treatment plan with the patient. The patient was provided an opportunity to ask questions and all were answered. The patient agreed with the plan and demonstrated an understanding of the instructions.   The patient was advised to call back or seek an in-person evaluation if the symptoms worsen or if the condition fails to improve as anticipated.  I provided 12 minutes of non-face-to-face time during this encounter.   Karle Plumber, MD

## 2019-11-01 ENCOUNTER — Ambulatory Visit: Payer: Self-pay | Admitting: Internal Medicine

## 2019-11-22 ENCOUNTER — Ambulatory Visit: Payer: Self-pay | Attending: Internal Medicine

## 2019-11-22 DIAGNOSIS — Z23 Encounter for immunization: Secondary | ICD-10-CM

## 2019-11-22 NOTE — Progress Notes (Signed)
   Covid-19 Vaccination Clinic  Name:  Maijor Hornig    MRN: [id number] DOB: March 08, 1961  11/22/2019  Mr. Old was observed post Covid-19 immunization for 15 minutes without incident. He was provided with Vaccine Information Sheet and instruction to access the V-Safe system.   Mr. Worley was instructed to call 911 with any severe reactions post vaccine: Marland Kitchen Difficulty breathing  . Swelling of face and throat  . A fast heartbeat  . A bad rash all over body  . Dizziness and weakness   Immunizations Administered    Name Date Dose VIS Date Route   Moderna COVID-19 Vaccine 11/22/2019  5:22 PM 0.5 mL 06/2019 Intramuscular   Manufacturer: Moderna   Lot: [id number]   NDC: 47185-501-58

## 2019-12-20 ENCOUNTER — Ambulatory Visit: Payer: Self-pay | Attending: Internal Medicine

## 2019-12-20 DIAGNOSIS — Z23 Encounter for immunization: Secondary | ICD-10-CM

## 2019-12-20 NOTE — Progress Notes (Addendum)
   Covid-19 Vaccination Clinic  Name:  Deloyd Handy    MRN: [id number] DOB: 1960-09-24  12/20/2019  Mr. Armstrong was observed post Covid-19 immunization for 15 minutes without incident. He was provided with Vaccine Information Sheet and instruction to access the V-Safe system.  Mr. Nazari was instructed to call 911 with any severe reactions post vaccine: Marland Kitchen Difficulty breathing  . Swelling of face and throat  . A fast heartbeat  . A bad rash all over body  . Dizziness and weakness   Immunizations Administered    Name Date Dose VIS Date Route   Moderna COVID-19 Vaccine 12/20/2019  5:19 PM 0.5 mL 06/2019 Intramuscular   Manufacturer: Moderna   Lot: [id number]   NDC: 62376-283-15

## 2021-06-16 IMAGING — DX DG KNEE COMPLETE 4+V*R*
5 series · 5 of 5 positions shown · non-contrast
Comparison: None.

CLINICAL DATA: Chronic right knee pain with difficulty standing and
walking.

EXAM:
RIGHT KNEE - COMPLETE 4+ VIEW

[knee ap]
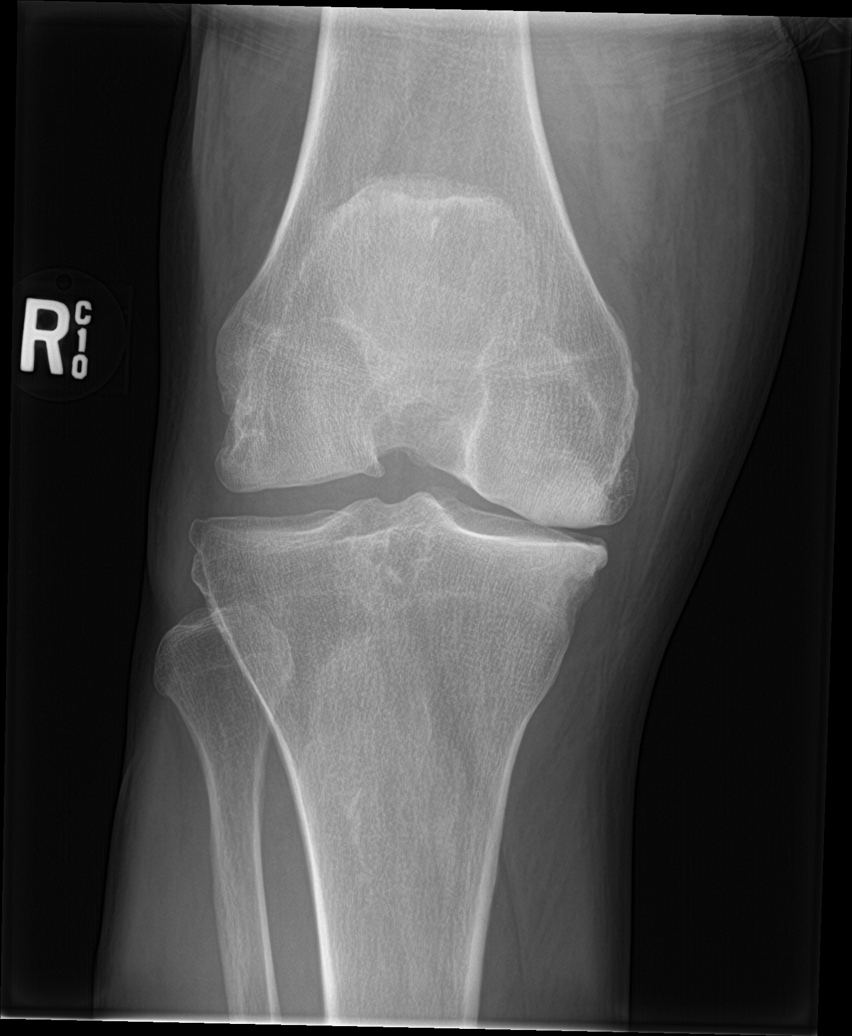

[knee lat (1 of 2)]
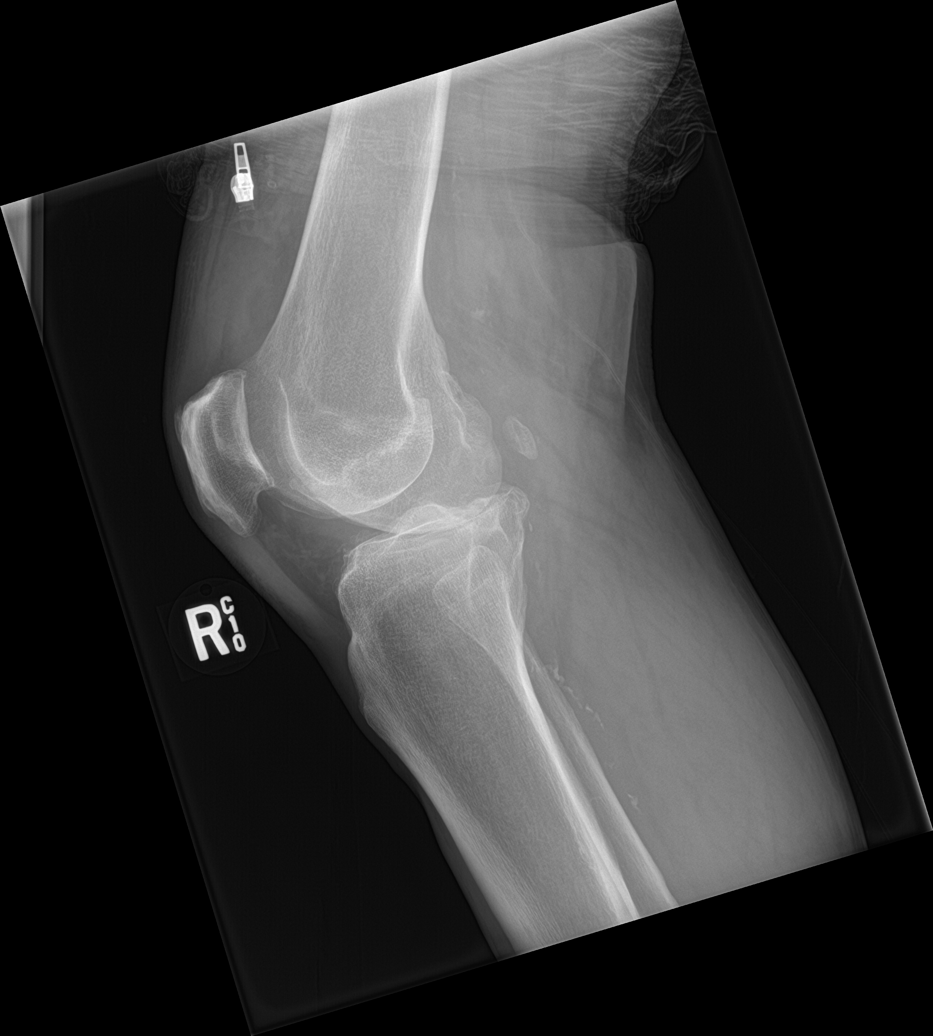

[knee obl (1 of 2)]
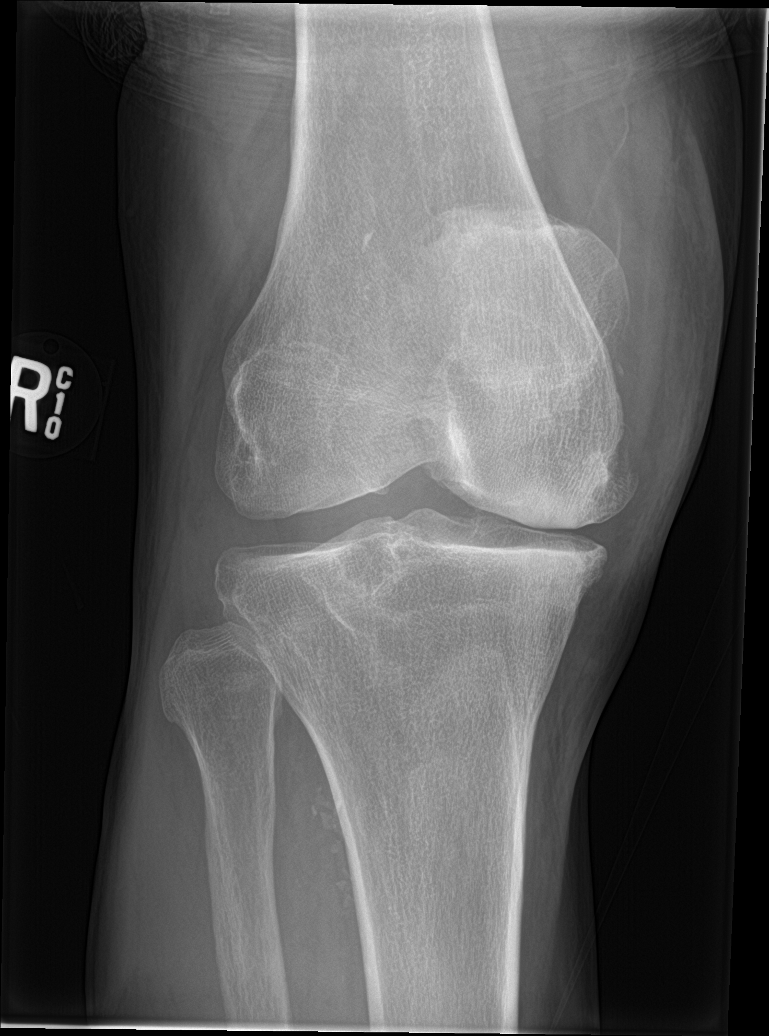

[knee obl (2 of 2)]
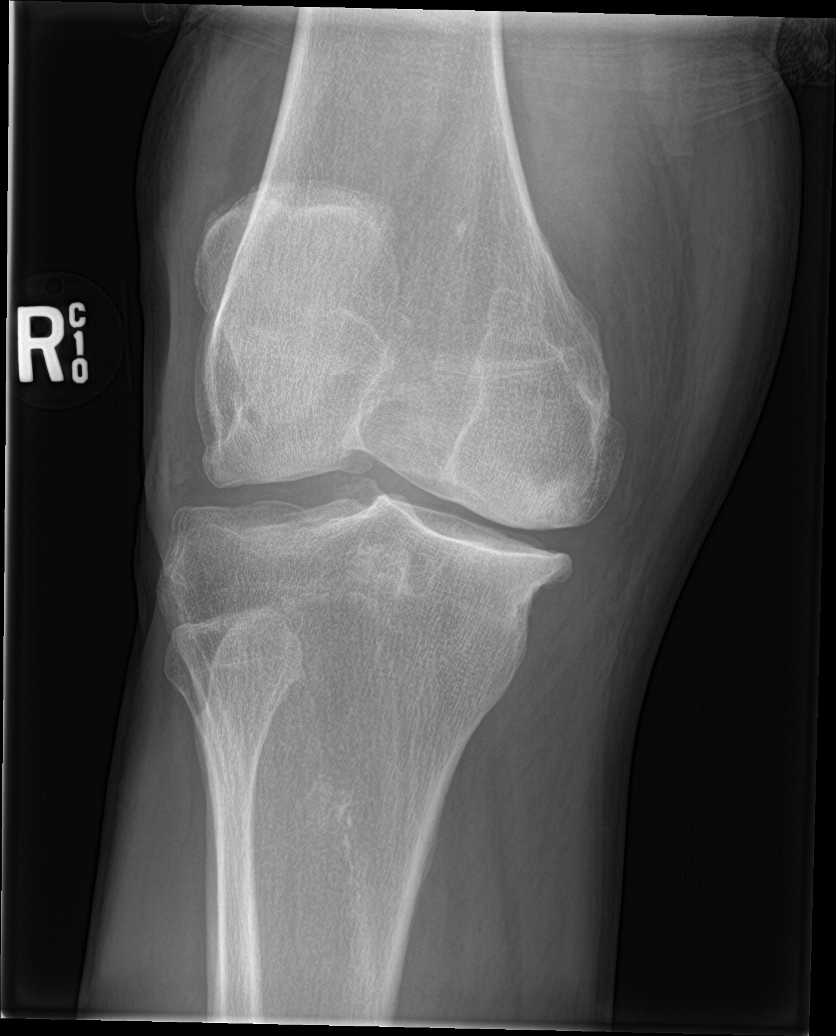

[knee lat (2 of 2)]
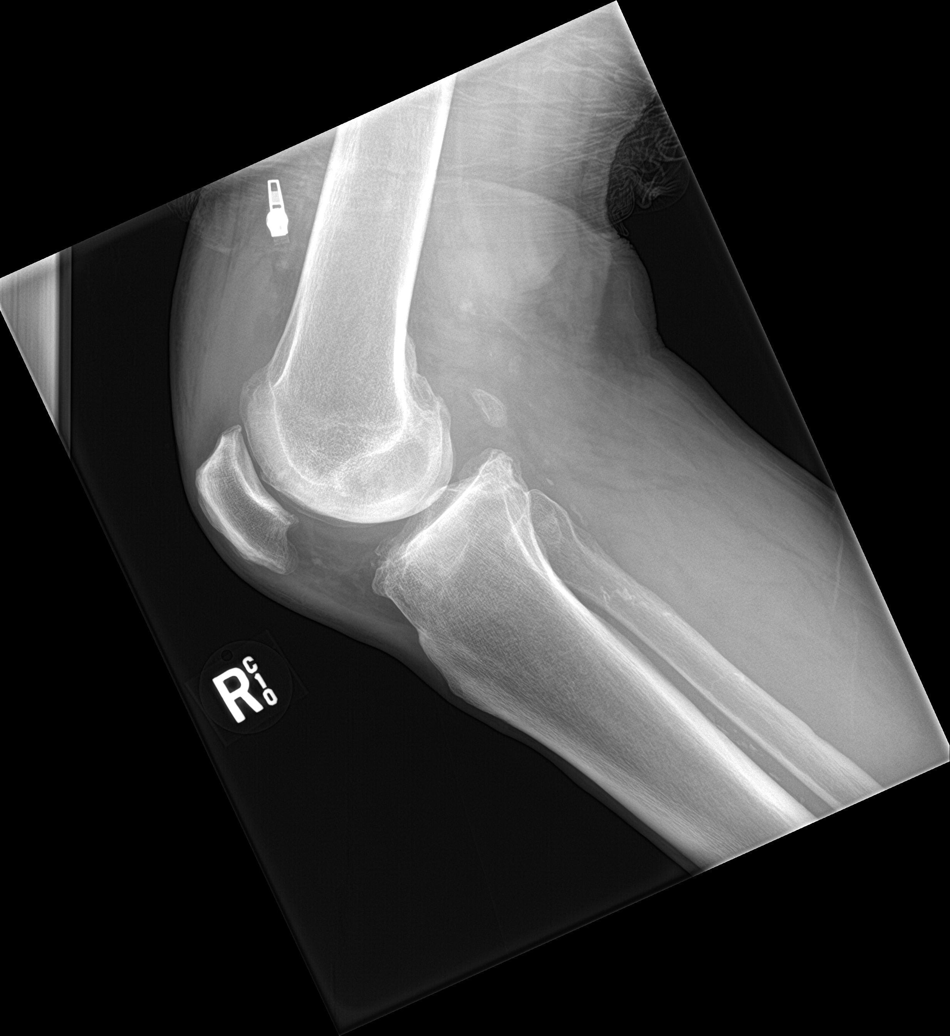

[5 of 5 positions shown; findings below may reference images not displayed]

FINDINGS: Mild tricompartmental osteoarthritis worse over the medial
compartment. No fracture or dislocation. Suggestion of small joint
effusion.
IMPRESSION: 1.  No acute fracture.

2. Tricompartmental osteoarthritis worse over the medial
compartment. Small joint effusion.

## 2021-06-26 ENCOUNTER — Ambulatory Visit: Payer: Self-pay | Admitting: Physician Assistant

## 2021-06-26 ENCOUNTER — Other Ambulatory Visit: Payer: Self-pay

## 2021-06-26 VITALS — BP 148/89 | HR 66 | Temp 98.2°F | Resp 18 | Ht 71.0 in | Wt 179.0 lb

## 2021-06-26 DIAGNOSIS — R030 Elevated blood-pressure reading, without diagnosis of hypertension: Secondary | ICD-10-CM

## 2021-06-26 DIAGNOSIS — M1711 Unilateral primary osteoarthritis, right knee: Secondary | ICD-10-CM

## 2021-06-26 DIAGNOSIS — Z6824 Body mass index (BMI) 24.0-24.9, adult: Secondary | ICD-10-CM

## 2021-06-26 MED ORDER — IBUPROFEN 800 MG PO TABS
800.0000 mg | ORAL_TABLET | Freq: Three times a day (TID) | ORAL | 0 refills | Status: AC | PRN
  Filled 2021-06-26: qty 30, 10d supply, fill #0

## 2021-06-26 NOTE — Progress Notes (Signed)
Established Patient Office Visit  Subjective:  Patient ID: Brett Wood, male    DOB: 09/14/1960  Age: 60 y.o. MRN: [id number]  CC:  Chief Complaint  Patient presents with   Knee Pain    HPI Trayce Maino reports that he continues to have right knee pain, states it is worse with the cold weather.  Denies injury [place]. Will have swelling intermittently.  Denies numbness or tingling.  States that he was previously taking mobic without much relief  States that he has been using ibuprofen 800 mg  with a small amount of relief .  Does state that this has been ongoing for "many years"  States that he has not previously been diagnosed with hypertension, does not check his blood pressure at home, denies any hypertensive symptoms.    Past Medical History:  Diagnosis Date   Allergic     Past Surgical History:  Procedure Laterality Date   NO PAST SURGERIES      History reviewed. No pertinent family history.  Social History   Socioeconomic History   Marital status: Married    Spouse name: Not on file   Number of children: 0   Years of education: 10   Highest education level: Not on file  Occupational History   Occupation: unemployed  Tobacco Use   Smoking status: Every Day    Packs/day: 0.25    Types: Cigarettes   Smokeless tobacco: Never  Vaping Use   Vaping Use: Never used  Substance and Sexual Activity   Alcohol use: Yes    Comment: occasion   Drug use: Not Currently    Types: Marijuana, Cocaine    Comment: former drug user   Sexual activity: Not Currently  Other Topics Concern   Not on file  Social History Narrative   Not on file   Social Determinants of Health   Financial Resource Strain: Not on file  Food Insecurity: Not on file  Transportation Needs: Not on file  Physical Activity: Not on file  Stress: Not on file  Social Connections: Not on file  Intimate Partner Violence: Not on file    Outpatient Medications Prior to Visit  Medication Sig  Dispense Refill   meloxicam (MOBIC) 15 MG tablet Take 1 tablet (15 mg total) by mouth daily. (Patient not taking: Reported on 06/26/2021) 30 tablet 2   varenicline (CHANTIX) 0.5 MG tablet Take 1 tablet (0.5 mg total) by mouth 2 (two) times daily. (Patient not taking: Reported on 06/26/2021) 60 tablet 0   No facility-administered medications prior to visit.    No Known Allergies  ROS Review of Systems  Constitutional: Negative.   HENT: Negative.    Eyes: Negative.   Respiratory:  Negative for shortness of breath.   Cardiovascular:  Negative for chest pain.  Endocrine: Negative.   Genitourinary: Negative.   Musculoskeletal:  Positive for arthralgias, joint swelling and myalgias.  Skin: Negative.   Allergic/Immunologic: Negative.   Neurological:  Negative for seizures and headaches.  Hematological: Negative.   Psychiatric/Behavioral: Negative.       Objective:    Physical Exam Vitals and nursing note reviewed.  Constitutional:      Appearance: Normal appearance.  HENT:     Head: Normocephalic and atraumatic.     Right Ear: External ear normal.     Left Ear: External ear normal.     Nose: Nose normal.     Mouth/Throat:     Mouth: Mucous membranes are moist.     Pharynx:  Oropharynx is clear.  Eyes:     Extraocular Movements: Extraocular movements intact.     Conjunctiva/sclera: Conjunctivae normal.     Pupils: Pupils are equal, round, and reactive to light.  Cardiovascular:     Rate and Rhythm: Normal rate and regular rhythm.     Pulses: Normal pulses.     Heart sounds: Normal heart sounds.  Pulmonary:     Effort: Pulmonary effort is normal.     Breath sounds: Normal breath sounds.  Musculoskeletal:     Cervical back: Normal range of motion and neck supple.     Right knee: Bony tenderness and crepitus present. No swelling. Normal range of motion.     Left knee: Normal.  Skin:    General: Skin is warm and dry.  Neurological:     General: No focal deficit present.      Mental Status: He is alert and oriented to person, place, and time.  Psychiatric:        Mood and Affect: Mood normal.        Behavior: Behavior normal.        Thought Content: Thought content normal.        Judgment: Judgment normal.    BP (!) 148/89 (BP Location: Left Arm, Patient Position: Sitting, Cuff Size: Normal)    Pulse 66    Temp 98.2 F (36.8 C) (Oral)    Resp 18    Ht 5\' 11"  (1.803 m)    Wt 179 lb (81.2 kg)    SpO2 100%    BMI 24.97 kg/m  Wt Readings from Last 3 Encounters:  06/26/21 179 lb (81.2 kg)  10/19/18 185 lb (83.9 kg)  12/26/16 185 lb (83.9 kg)     Health Maintenance Due  Topic Date Due   Pneumococcal Vaccine 82-72 Years old (1 - PCV) Never done   Hepatitis C Screening  Never done   TETANUS/TDAP  Never done   COLONOSCOPY (Pts 45-74yrs Insurance coverage will need to be confirmed)  Never done   Zoster Vaccines- Shingrix (1 of 2) Never done   COVID-19 Vaccine (3 - Booster for Moderna series) 02/14/2020   INFLUENZA VACCINE  Never done    There are no preventive care reminders to display for this patient.  No results found for: TSH Lab Results  Component Value Date   WBC 17.6 (H) 12/28/2016   HGB 12.1 (L) 12/28/2016   HCT 37.0 (L) 12/28/2016   MCV 91.4 12/28/2016   PLT 210 12/28/2016   Lab Results  Component Value Date   NA 136 12/28/2016   K 3.6 12/28/2016   CO2 26 12/28/2016   GLUCOSE 98 12/28/2016   BUN 12 12/28/2016   CREATININE 0.84 12/28/2016   BILITOT 1.3 (H) 12/27/2016   ALKPHOS 71 12/27/2016   AST 14 (L) 12/27/2016   ALT 11 (L) 12/27/2016   PROT 7.4 12/27/2016   ALBUMIN 3.1 (L) 12/27/2016   CALCIUM 9.6 12/28/2016   ANIONGAP 9 12/28/2016   No results found for: CHOL No results found for: HDL No results found for: LDLCALC No results found for: TRIG No results found for: CHOLHDL No results found for: 12/30/2016    Assessment & Plan:   Problem List Items Addressed This Visit       Musculoskeletal and Integument   Primary  osteoarthritis of right knee - Primary   Relevant Medications   ibuprofen (ADVIL) 800 MG tablet   Other Relevant Orders   DG Knee 1-2 Views Right  Other Visit Diagnoses     Elevated blood pressure reading in office without diagnosis of hypertension           Meds ordered this encounter  Medications   ibuprofen (ADVIL) 800 MG tablet    Sig: Take 1 tablet (800 mg total) by mouth every 8 (eight) hours as needed.    Dispense:  30 tablet    Refill:  0    Order Specific Question:   Supervising Provider    Answer:   Delford Field, PATRICK E [[id number]]  1. Primary osteoarthritis of right knee Trial ibuprofen, patient education given on supportive care.  Red flags given for prompt reevaluation.   - DG Knee 1-2 Views Right; Future - ibuprofen (ADVIL) 800 MG tablet; Take 1 tablet (800 mg total) by mouth every 8 (eight) hours as needed.  Dispense: 30 tablet; Refill: 0  2. Elevated blood pressure reading in office without diagnosis of hypertension Patient encouraged to check blood pressure at home, keep a written log and have available for all office visits.  Patient to return to mobile unit in 1 week for blood pressure check and fasting labs to be completed.  Patient understands and agrees.   I have reviewed the patient's medical history (PMH, PSH, Social History, Family History, Medications, and allergies) , and have been updated if relevant. I spent 30 minutes reviewing chart and  face to face time with patient.    Follow-up: Return in about 1 week (around 07/03/2021).    Kasandra Knudsen Mayers, PA-C

## 2021-06-26 NOTE — Patient Instructions (Signed)
Please return to the mobile unit in 1 week for follow-up on your elevated blood pressure.  Please come earlier during the day so we can also do your fasting labs.  For your knee pain, I do encourage you to wear a brace, use ice, and you can use ibuprofen 800 mg every 8 hours.  We will call you with your x-ray results.  Roney Jaffe, PA-C Physician Assistant [facility] [email or web address]  Acute Knee Pain, Adult Acute knee pain is sudden and may be caused by damage, swelling, or irritation of the muscles and tissues that support the knee. Pain may result from: A fall. An injury to the knee from twisting motions. A hit to the knee. Infection. Acute knee pain may go away on its own with time and rest. If it does not, your health care provider may order tests to find the cause of the pain. These may include: Imaging tests, such as an X-ray, MRI, CT scan, or ultrasound. Joint aspiration. In this test, fluid is removed from the knee and evaluated. Arthroscopy. In this test, a lighted tube is inserted into the knee and an image is projected onto a TV screen. Biopsy. In this test, a sample of tissue is removed from the body and studied under a microscope. Follow these instructions at home: If you have a knee sleeve or brace:  Wear the knee sleeve or brace as told by your health care provider. Remove it only as told by your health care provider. Loosen it if your toes tingle, become numb, or turn cold and blue. Keep it clean. If the knee sleeve or brace is not waterproof: Do not let it get wet. Cover it with a watertight covering when you take a bath or shower. Activity Rest your knee. Do not do things that cause pain or make pain worse. Avoid high-impact activities or exercises, such as running, jumping rope, or doing jumping jacks. Work with a physical therapist to make a safe exercise program, as recommended by your health care  provider. Do exercises as told by your physical therapist. Managing pain, stiffness, and swelling  If directed, put ice on the affected knee. To do this: If you have a removable knee sleeve or brace, remove it as told by your health care provider. Put ice in a plastic bag. Place a towel between your skin and the bag. Leave the ice on for 20 minutes, 2-3 times a day. Remove the ice if your skin turns bright red. This is very important. If you cannot feel pain, heat, or cold, you have a greater risk of damage to the area. If directed, use an elastic bandage to put pressure (compression) on your injured knee. This may control swelling, give support, and help with discomfort. Raise (elevate) your knee above the level of your heart while you are sitting or lying down. Sleep with a pillow under your knee. General instructions Take over-the-counter and prescription medicines only as told by your health care provider. Do not use any products that contain nicotine or tobacco, such as cigarettes, e-cigarettes, and chewing tobacco. If you need help quitting, ask your health care provider. If you are overweight, work with your health care provider and a dietitian to set a weight-loss goal that is healthy and reasonable for you. Extra weight can put pressure on your knee. Pay attention to any changes in your symptoms. Keep all follow-up visits. This is important. Contact a health care provider if: Your knee pain continues, changes,  or gets worse. You have a fever along with knee pain. Your knee feels warm to the touch or is red. Your knee buckles or locks up. Get help right away if: Your knee swells, and the swelling becomes worse. You cannot move your knee. You have severe pain in your knee that cannot be managed with pain medicine. Summary Acute knee pain can be caused by a fall, an injury, an infection, or damage, swelling, or irritation of the tissues that support your knee. Your health care  provider may perform tests to find out the cause of the pain. Pay attention to any changes in your symptoms. Relieve your pain with rest, medicines, light activity, and the use of ice. Get help right away if your knee swells, you cannot move your knee, or you have severe pain that cannot be managed with medicine. This information is not intended to replace advice given to you by your health care provider. Make sure you discuss any questions you have with your health care provider. Document Revised: 12/15/2019 Document Reviewed: 12/15/2019 Elsevier Patient Education  2022 [employer]  Preventing Hypertension Hypertension, also called high blood pressure, is when the force of blood pumping through the arteries is too strong. Arteries are blood vessels that carry blood from the heart throughout the body. Often, hypertension does not cause symptoms until blood pressure is very high. It is important to have your blood pressure checked regularly. Diet and lifestyle changes can help you prevent hypertension, and they may make you feel better overall and improve your quality of life. If you already have hypertension, you may control it with diet and lifestyle changes, as well as with medicine. How can this condition affect me? Over time, hypertension can damage the arteries and decrease blood flow to important parts of the body, including the brain, heart, and kidneys. By keeping your blood pressure in a healthy range, you can help prevent complications like heart attack, heart failure, stroke, kidney failure, and vascular dementia. What can increase my risk? Being an older adult. Older people are more often affected. Having family members who have had high blood pressure. Being obese. Being male. Males are more likely to have high blood pressure. Drinking too much alcohol or caffeine. Smoking or using illegal drugs. Taking certain medicines, such as antidepressants, decongestants, birth control pills,  and NSAIDs, such as ibuprofen. Having thyroid problems. Having certain tumors. What actions can I take to prevent or manage this condition? Work with your health care provider to make a hypertension prevention plan that works for you. Follow your plan and keep all follow-up visits as told by your health care provider. Diet changes Maintain a healthy diet. This includes: Eating less salt (sodium). Ask your health care provider how much sodium is safe for you to have. The general recommendation is to have less than 1 tsp (2,300 mg) of sodium a day. Do not add salt to your food. Choose low-sodium options when grocery shopping and eating out. Limiting fats in your diet. You can do this by eating low-fat or fat-free dairy products and by eating less red meat. Eating more fruits, vegetables, and whole grains. Make a goal to eat: 1-2 cups of fresh fruits and vegetables each day. 3-4 servings of whole grains each day. Avoiding foods and beverages that have added sugars. Eating fish that contain healthy fats (omega-3 fatty acids), such as mackerel or salmon. If you need help putting together a healthy eating plan, try the DASH diet. This diet is  high in fruits, vegetables, and whole grains. It is low in sodium, red meat, and added sugars. DASH stands for Dietary Approaches to Stop Hypertension. Lifestyle changes Lose weight if you are overweight. Losing just 3?5% of your body weight can help prevent or control hypertension. For example, if your present weight is 200 lb (91 kg), a loss of 3-5% of your weight means losing 6-10 lb (2.7-4.5 kg). Ask your health care provider to help you with a diet and exercise plan to safely lose weight. Other recommendations usually include: Get enough exercise. Do at least 150 minutes of moderate-intensity exercise each week. You could do this in short exercise sessions several times a day, or you could do longer exercise sessions a few times a week. For example, you  could take a brisk 10-minute walk or bike ride, 3 times a day, for 5 days a week. Find ways to reduce stress, such as exercising, meditating, listening to music, or taking a yoga class. If you need help reducing stress, ask your health care provider. Do not use any products that contain nicotine or tobacco, such as cigarettes, e-cigarettes, and chewing tobacco. If you need help quitting, ask your health care provider. Chemicals in tobacco and nicotine products raise your blood pressure each time you use them. If you need help quitting, ask your health care provider. Learn how to check your blood pressure at home. Make sure that you know your personal target blood pressure, as told by your health care provider. Try to sleep 7-9 hours per night.  Alcohol use Do not drink alcohol if: Your health care provider tells you not to drink. You are pregnant, may be pregnant, or are planning to become pregnant. If you drink alcohol: Limit how much you use to: 0-1 drink a day for women. 0-2 drinks a day for men. Be aware of how much alcohol is in your drink. In the [place] one drink equals one 12 oz bottle of beer (355 mL), one 5 oz glass of wine (148 mL), or one 1 oz glass of hard liquor (44 mL). Medicines In addition to diet and lifestyle changes, your health care provider may recommend medicines to help lower your blood pressure. In general: You may need to try a few different medicines to find what works best for you. You may need to take more than one medicine. Take over-the-counter and prescription medicines only as told by your health care provider. Questions to ask your health care provider What is my blood pressure goal? How can I lower my risk for high blood pressure? How should I monitor my blood pressure at home? Where to find support Your health care provider can help you prevent hypertension and help you keep your blood pressure at a healthy level. Your local hospital or your community may  also provide support services and prevention programs. The American Heart Association offers an online support network at supportnetwork.heart.org Where to find more information Learn more about hypertension from: National Heart, Lung, and Blood Institute: PopSteam.is Centers for Disease Control and Prevention: FootballExhibition.com.br American Academy of Family Physicians: familydoctor.org Learn more about the DASH diet from: National Heart, Lung, and Blood Institute: PopSteam.is Contact a health care provider if: You think you are having a reaction to medicines you have taken. You have recurrent headaches or feel dizzy. You have swelling in your ankles. You have trouble with your vision. Get help right away if: You have sudden, severe chest, back, or abdominal pain or discomfort. You have shortness  of breath. You have a sudden, severe headache. These symptoms may represent a serious problem that is an emergency. Do not wait to see if the symptoms will go away. Get medical help right away. Call your local emergency services (911 in the [place] Do not drive yourself to the hospital.  Summary Hypertension often does not cause any symptoms until blood pressure is very high. It is important to get your blood pressure checked regularly. Diet and lifestyle changes are important steps in preventing hypertension. By keeping your blood pressure in a healthy range, you may prevent complications like heart attack, heart failure, stroke, and kidney failure. Work with your health care provider to make a hypertension prevention plan that works for you. This information is not intended to replace advice given to you by your health care provider. Make sure you discuss any questions you have with your health care provider. Document Revised: 06/01/2019 Document Reviewed: 06/01/2019 Elsevier Patient Education  2022 [employer].

## 2021-06-26 NOTE — Progress Notes (Signed)
Patient has eaten today. Patient reports right knee pain increased with walking and cold weather. Pain scaled at a 10 currently.

## 2021-06-27 ENCOUNTER — Other Ambulatory Visit: Payer: Self-pay

## 2021-06-27 ENCOUNTER — Emergency Department (HOSPITAL_COMMUNITY): Admission: EM | Admit: 2021-06-27 | Discharge: 2021-06-27 | Discharge status: Against Medical Advice | Payer: Self-pay

## 2021-06-27 DIAGNOSIS — R030 Elevated blood-pressure reading, without diagnosis of hypertension: Secondary | ICD-10-CM | POA: Insufficient documentation

## 2021-06-27 NOTE — ED Notes (Signed)
Called for triage, no answer

## 2021-07-03 ENCOUNTER — Other Ambulatory Visit: Payer: Self-pay

## 2022-02-20 LAB — GLUCOSE, POCT (MANUAL RESULT ENTRY): POC Glucose: 87 mg/dl (ref 70–99)

## 2023-11-04 ENCOUNTER — Ambulatory Visit

## 2023-11-04 NOTE — Progress Notes (Unsigned)
 Privacy practices reviewed. HIPAA form signed. Pt needs assistance with ortho appt and PCP. Both appointments made. Pt to follow up on [place] next Tuesday.

## 2023-11-04 NOTE — Patient Instructions (Signed)
 PCP appointment made with [facility] 02/05/24 at 1:00 @ [address] [place], [place], [address]

## 2023-11-07 ENCOUNTER — Ambulatory Visit: Payer: Self-pay | Admitting: Physician Assistant
